# Patient Record
Sex: Female | Born: 1975 | Race: Black or African American | Hispanic: No | Marital: Married | Smoking: Current some day smoker
Health system: Southern US, Community
[De-identification: ages and names within clinical notes are randomized; demographics above are authoritative.]

## PROBLEM LIST (undated history)

## (undated) DIAGNOSIS — I10 Essential (primary) hypertension: Secondary | ICD-10-CM

## (undated) DIAGNOSIS — N841 Polyp of cervix uteri: Secondary | ICD-10-CM

## (undated) DIAGNOSIS — D869 Sarcoidosis, unspecified: Secondary | ICD-10-CM

## (undated) DIAGNOSIS — D509 Iron deficiency anemia, unspecified: Secondary | ICD-10-CM

## (undated) DIAGNOSIS — I2699 Other pulmonary embolism without acute cor pulmonale: Secondary | ICD-10-CM

## (undated) HISTORY — PX: TOTAL VAGINAL HYSTERECTOMY: SHX2548

## (undated) HISTORY — PX: NO PAST SURGERIES: SHX2092

## (undated) HISTORY — PX: BREAST SURGERY: SHX581

## (undated) HISTORY — DX: Iron deficiency anemia, unspecified: D50.9

## (undated) HISTORY — DX: Polyp of cervix uteri: N84.1

---

## 1998-02-12 ENCOUNTER — Encounter: Admission: RE | Admit: 1998-02-12 | Discharge: 1998-02-12 | Payer: Self-pay | Admitting: *Deleted

## 2014-06-03 DIAGNOSIS — H209 Unspecified iridocyclitis: Secondary | ICD-10-CM | POA: Insufficient documentation

## 2014-09-09 DIAGNOSIS — D869 Sarcoidosis, unspecified: Secondary | ICD-10-CM | POA: Insufficient documentation

## 2015-04-19 ENCOUNTER — Emergency Department (INDEPENDENT_AMBULATORY_CARE_PROVIDER_SITE_OTHER)
Admission: EM | Admit: 2015-04-19 | Discharge: 2015-04-19 | Disposition: A | Discharge status: Home / Self Care | Payer: Managed Care, Other (non HMO) | Source: Home / Self Care | Attending: Family Medicine | Admitting: Family Medicine

## 2015-04-19 ENCOUNTER — Encounter: Payer: Self-pay | Admitting: *Deleted

## 2015-04-19 DIAGNOSIS — R0981 Nasal congestion: Secondary | ICD-10-CM | POA: Diagnosis not present

## 2015-04-19 HISTORY — DX: Sarcoidosis, unspecified: D86.9

## 2015-04-19 HISTORY — DX: Essential (primary) hypertension: I10

## 2015-04-19 MED ORDER — AMOXICILLIN 875 MG PO TABS: 875.0000 mg | ORAL_TABLET | Freq: Two times a day (BID) | ORAL | Status: DC

## 2015-04-19 MED ORDER — PREDNISONE 20 MG PO TABS: 20.0000 mg | ORAL_TABLET | Freq: Two times a day (BID) | ORAL | Status: DC

## 2015-04-19 NOTE — ED Provider Notes (Signed)
CSN: [id number]     Arrival date & time 04/19/15  1003 History   First MD Initiated Contact with Patient 04/19/15 1046     Chief Complaint  Patient presents with  . Nasal Congestion      HPI Comments: Patient reports that she developed sinus congestion about two weeks ago that has persisted, and became significantly worse yesterday.  She denies cough, sore throat, and fever.  The history is provided by the patient.    Past Medical History  Diagnosis Date  . Sarcoidosis ([place])   . Hypertension    History reviewed. No pertinent past surgical history. Family History  Problem Relation Age of Onset  . Hypertension Mother    Social History  Substance Use Topics  . Smoking status: Former Research scientist (life sciences)  . Smokeless tobacco: None  . Alcohol Use: Yes   OB History    No data available     Review of Systems No sore throat No cough No pleuritic pain No wheezing + nasal congestion + post-nasal drainage + sinus pain/pressure No itchy/red eyes No earache No hemoptysis No SOB No fever/chills No nausea No vomiting No abdominal pain No diarrhea No urinary symptoms No skin rash No fatigue No myalgias No headache Used OTC meds without relief   Allergies  Review of patient's allergies indicates no known allergies.  Home Medications   Prior to Admission medications   Medication Sig Start Date End Date Taking? Authorizing Provider  amoxicillin (AMOXIL) 875 MG tablet Take 1 tablet (875 mg total) by mouth 2 (two) times daily. 04/19/15   Kandra Nicolas, MD  predniSONE (DELTASONE) 20 MG tablet Take 1 tablet (20 mg total) by mouth 2 (two) times daily. Take with food. 04/19/15   Kandra Nicolas, MD   Meds Ordered and Administered this Visit  Medications - No data to display  BP 160/89 mmHg  Pulse 84  Temp(Src) 97.7 F (36.5 C) (Oral)  Resp 18  Ht 5\' 5"  (1.651 m)  Wt 173 lb (78.472 kg)  BMI 28.79 kg/m2  SpO2 100%  LMP 04/13/2015 No data found.   Physical Exam Nursing notes  and Vital Signs reviewed. Appearance:  Patient appears stated age, and in no acute distress Eyes:  Pupils are equal, round, and reactive to light and accomodation.  Extraocular movement is intact.  Conjunctivae are not inflamed  Ears:  Canals normal.  Tympanic membranes normal.  Nose:  Congested turbinates.  No sinus tenderness.    Pharynx:  Normal Neck:  Supple.  No adenopathy.  Skin:  No rash present.   ED Course  Procedures  None    MDM   1. Sinus congestion; ?sinusitis; New onset allergic rhinitis.   Begin amoxicillin and prednisone burst. Take plain guaifenesin (1200mg  extended release tabs such as Mucinex) twice daily, with plenty of water, for cough and congestion.  May add Pseudoephedrine (30mg , one or two every 4 to 6 hours) for sinus congestion.  Get adequate rest.   May use Afrin nasal spray (or generic oxymetazoline) twice daily for about 5 days and then discontinue.  Also recommend using saline nasal spray several times daily and saline nasal irrigation (AYR is a common brand).  Use Flonase nasal spray each morning after using Afrin nasal spray and saline nasal irrigation.  Stop all antihistamines for now, and other non-prescription cough/cold preparations. Followup with Family Doctor if not improved in 7 to 10 days      Kandra Nicolas, MD 04/19/15 1102

## 2015-04-19 NOTE — ED Notes (Signed)
Pt c/o nasal congestion x 2 wks, worse x 1 day.

## 2015-04-19 NOTE — Discharge Instructions (Signed)
Take plain guaifenesin (1200mg extended release tabs such as Mucinex) twice daily, with plenty of water, for cough and congestion.  May add Pseudoephedrine (30mg, one or two every 4 to 6 hours) for sinus congestion.  Get adequate rest.   °May use Afrin nasal spray (or generic oxymetazoline) twice daily for about 5 days and then discontinue.  Also recommend using saline nasal spray several times daily and saline nasal irrigation (AYR is a common brand).  Use Flonase nasal spray each morning after using Afrin nasal spray and saline nasal irrigation. °Stop all antihistamines for now, and other non-prescription cough/cold preparations. °  °  °

## 2015-07-22 DIAGNOSIS — B353 Tinea pedis: Secondary | ICD-10-CM | POA: Insufficient documentation

## 2016-05-14 ENCOUNTER — Emergency Department (INDEPENDENT_AMBULATORY_CARE_PROVIDER_SITE_OTHER)
Admission: EM | Admit: 2016-05-14 | Discharge: 2016-05-14 | Disposition: A | Discharge status: Home / Self Care | Payer: 59 | Source: Home / Self Care | Attending: Family Medicine | Admitting: Family Medicine

## 2016-05-14 ENCOUNTER — Encounter: Payer: Self-pay | Admitting: Emergency Medicine

## 2016-05-14 DIAGNOSIS — K047 Periapical abscess without sinus: Secondary | ICD-10-CM | POA: Diagnosis not present

## 2016-05-14 DIAGNOSIS — R220 Localized swelling, mass and lump, head: Secondary | ICD-10-CM

## 2016-05-14 MED ORDER — AMOXICILLIN-POT CLAVULANATE 875-125 MG PO TABS: 1.0000 | ORAL_TABLET | Freq: Two times a day (BID) | ORAL | 0 refills | Status: DC

## 2016-05-14 MED ORDER — TRAMADOL HCL 50 MG PO TABS: 50.0000 mg | ORAL_TABLET | Freq: Four times a day (QID) | ORAL | 0 refills | Status: DC | PRN

## 2016-05-14 NOTE — Discharge Instructions (Signed)
°  Please take antibiotics as prescribed and be sure to complete entire course even if you start to feel better to ensure infection does not come back.  Tramadol is strong pain medication. While taking, do not drink alcohol, drive, or perform any other activities that requires focus while taking these medications.   You may continue to take Advil as well to help with facial pain and swelling.

## 2016-05-14 NOTE — ED Provider Notes (Signed)
CSN: [id number]     Arrival date & time 05/14/16  1405 History   First MD Initiated Contact with Patient 05/14/16 1438     Chief Complaint  Patient presents with  . Facial Swelling   (Consider location/radiation/quality/duration/timing/severity/associated sxs/prior Treatment) HPI Marissa Chaney is a 41 y.o. female presenting to UC with c/o sudden onset Right facial swelling and pain that started when she woke up.  Pain is aching and throbbing, 5/10.  Pt concerned she has a dental abscess. She reports having a chipped tooth from several months ago but states she was not having any pain on the tooth until just this morning.  She does recall feeling run down and generally achy the last 2 days but did not think much of it.  She does have a dentist but has not f/u with them "in a while."    Past Medical History:  Diagnosis Date  . Hypertension   . Sarcoidosis ([place])    History reviewed. No pertinent surgical history. Family History  Problem Relation Age of Onset  . Hypertension Mother    Social History  Substance Use Topics  . Smoking status: Former Research scientist (life sciences)  . Smokeless tobacco: Current User  . Alcohol use Yes   OB History    No data available     Review of Systems  Constitutional: Positive for fatigue. Negative for chills and fever.  HENT: Positive for facial swelling (Right side). Negative for ear pain, sneezing, sore throat, trouble swallowing and voice change.   Gastrointestinal: Negative for diarrhea, nausea and vomiting.    Allergies  Patient has no known allergies.  Home Medications   Prior to Admission medications   Medication Sig Start Date End Date Taking? Authorizing Provider  amoxicillin-clavulanate (AUGMENTIN) 875-125 MG tablet Take 1 tablet by mouth 2 (two) times daily. One po bid x 7 days 05/14/16   Noland Fordyce, PA-C  traMADol (ULTRAM) 50 MG tablet Take 1 tablet (50 mg total) by mouth every 6 (six) hours as needed. 05/14/16   Noland Fordyce, PA-C   Meds Ordered  and Administered this Visit  Medications - No data to display  BP 143/98 (BP Location: Left Arm)   Pulse 96   Temp 99.2 F (37.3 C) (Oral)   Ht 5\' 4"  (1.626 m)   Wt 152 lb 12 oz (69.3 kg)   SpO2 100%   BMI 26.22 kg/m  No data found.   Physical Exam  Constitutional: She is oriented to person, place, and time. She appears well-developed and well-nourished. No distress.  HENT:  Head: Normocephalic and atraumatic.  Right Ear: Tympanic membrane normal.  Left Ear: Tympanic membrane normal.  Nose: Nose normal.  Mouth/Throat: Uvula is midline, oropharynx is clear and moist and mucous membranes are normal. There is trismus ( mild) in the jaw. Dental abscesses and dental caries present.    Dental decay with gingival abscess in Right upper teeth and gums. Scan purulent discharge.  Right side facial swelling over maxillary sinus. Tender.   Eyes: EOM are normal.  Neck: Normal range of motion.  Cardiovascular: Normal rate.   Pulmonary/Chest: Effort normal.  Musculoskeletal: Normal range of motion.  Neurological: She is alert and oriented to person, place, and time.  Skin: Skin is warm and dry. She is not diaphoretic.  Psychiatric: She has a normal mood and affect. Her behavior is normal.  Nursing note and vitals reviewed.   Urgent Care Course     Procedures (including critical care time)  Labs Review Labs Reviewed -  No data to display  Imaging Review No results found.   MDM   1. Dental abscess   2. Right facial swelling    Hx and exam c/w Right dental abscess.  Rx: Augmentin and tramadol   Encouraged to call dentist this week for f/u. Dental resource guide also provided since she has not seen her dentist "in a while" and may not be able to fit her in right away.    Noland Fordyce, PA-C 05/14/16 1534

## 2016-05-14 NOTE — ED Triage Notes (Signed)
Pt c/o right sided facial swelling this morning, unsure if coming from chipped tooth or not.

## 2016-05-16 ENCOUNTER — Telehealth: Payer: Self-pay | Admitting: *Deleted

## 2016-05-16 NOTE — Telephone Encounter (Signed)
Callback: no answer, LMOM f/u from visit. Call back as needed.

## 2016-05-17 ENCOUNTER — Telehealth: Payer: Self-pay

## 2016-05-17 MED ORDER — FLUCONAZOLE 150 MG PO TABS: ORAL_TABLET | ORAL | 0 refills | Status: DC

## 2016-06-30 ENCOUNTER — Encounter: Payer: Self-pay | Admitting: *Deleted

## 2016-06-30 ENCOUNTER — Emergency Department (INDEPENDENT_AMBULATORY_CARE_PROVIDER_SITE_OTHER)
Admission: EM | Admit: 2016-06-30 | Discharge: 2016-06-30 | Disposition: A | Discharge status: Home / Self Care | Payer: 59 | Source: Home / Self Care | Attending: Family Medicine | Admitting: Family Medicine

## 2016-06-30 DIAGNOSIS — J101 Influenza due to other identified influenza virus with other respiratory manifestations: Secondary | ICD-10-CM | POA: Diagnosis not present

## 2016-06-30 LAB — POCT CBC W AUTO DIFF (K'VILLE URGENT CARE)

## 2016-06-30 LAB — POCT INFLUENZA A/B
Influenza A, POC: POSITIVE — AB
Influenza B, POC: NEGATIVE

## 2016-06-30 MED ORDER — IBUPROFEN 600 MG PO TABS
600.0000 mg | ORAL_TABLET | Freq: Once | ORAL | Status: AC
  Administered 2016-06-30: 600 mg via ORAL

## 2016-06-30 MED ORDER — OSELTAMIVIR PHOSPHATE 75 MG PO CAPS: 75.0000 mg | ORAL_CAPSULE | Freq: Two times a day (BID) | ORAL | 0 refills | Status: DC

## 2016-06-30 MED ORDER — AZITHROMYCIN 250 MG PO TABS: ORAL_TABLET | ORAL | 0 refills | Status: DC

## 2016-06-30 NOTE — ED Provider Notes (Signed)
[facility]    CSN: [id number] Arrival date & time: 06/30/16  1306     History   Chief Complaint Chief Complaint  Patient presents with  . Fever  . Cough    HPI Marissa Chaney is a 41 y.o. female.   Three days ago patient suddenly developed a non-productive cough and fever, and the next day she developed nasal congestion but no sore throat.  Her fever persists each night.  She has been fatigued.  No pleuritic pain or shortness of breath.  She has a history of sarcoid.   The history is provided by the patient.    Past Medical History:  Diagnosis Date  . Hypertension   . Sarcoidosis ([employer])     There are no active problems to display for this patient.   History reviewed. No pertinent surgical history.  OB History    No data available       Home Medications    Prior to Admission medications   Medication Sig Start Date End Date Taking? Authorizing Provider  azithromycin (ZITHROMAX Z-PAK) 250 MG tablet Take 2 tabs today; then begin one tab once daily for 4 more days. 06/30/16   Kandra Nicolas, MD  oseltamivir (TAMIFLU) 75 MG capsule Take 1 capsule (75 mg total) by mouth every 12 (twelve) hours. 06/30/16   Kandra Nicolas, MD    Family History Family History  Problem Relation Age of Onset  . Hypertension Mother     Social History Social History  Substance Use Topics  . Smoking status: Former Research scientist (life sciences)  . Smokeless tobacco: Current User  . Alcohol use Yes     Allergies   Patient has no known allergies.   Review of Systems Review of Systems No sore throat + cough No pleuritic pain No wheezing + nasal congestion ? post-nasal drainage No sinus pain/pressure No itchy/red eyes No earache No hemoptysis No SOB + fever, + chills No nausea No vomiting No abdominal pain No diarrhea No urinary symptoms No skin rash + fatigue + myalgias No headache Used OTC meds without relief   Physical Exam Triage Vital Signs ED Triage Vitals  Enc  Vitals Group     BP 06/30/16 1344 125/84     Pulse Rate 06/30/16 1344 (!) 111     Resp 06/30/16 1344 20     Temp 06/30/16 1344 (!) 102.5 F (39.2 C)     Temp Source 06/30/16 1344 Oral     SpO2 06/30/16 1344 99 %     Weight 06/30/16 1345 144 lb (65.3 kg)     Height --      Head Circumference --      Peak Flow --      Pain Score 06/30/16 1345 0     Pain Loc --      Pain Edu? --      Excl. in [place]? --    No data found.   Updated Vital Signs BP 125/84 (BP Location: Left Arm)   Pulse (!) 111   Temp (!) 102.5 F (39.2 C) (Oral)   Resp 20   Wt 144 lb (65.3 kg)   LMP 06/29/2016   SpO2 99%   BMI 24.72 kg/m   Visual Acuity Right Eye Distance:   Left Eye Distance:   Bilateral Distance:    Right Eye Near:   Left Eye Near:    Bilateral Near:     Physical Exam Nursing notes and Vital Signs reviewed. Appearance:  Patient appears stated  age, and in no acute distress Eyes:  Pupils are equal, round, and reactive to light and accomodation.  Extraocular movement is intact.  Conjunctivae are not inflamed  Ears:  Canals normal.  Tympanic membranes normal.  Nose:  Mildly congested turbinates.  No sinus tenderness.    Pharynx:  Normal Neck:  Supple.  Nontender enlarged posterior/lateral nodes are palpated bilaterally  Lungs:  Clear to auscultation.  Breath sounds are equal.  Moving air well. Heart:  Regular rate and rhythm without murmurs, rubs, or gallops.  Abdomen:  Nontender without masses or hepatosplenomegaly.  Bowel sounds are present.  No CVA or flank tenderness.  Extremities:  No edema.  Skin:  No rash present.    UC Treatments / Results  Labs (all labs ordered are listed, but only abnormal results are displayed) Labs Reviewed  POCT INFLUENZA A/B - Abnormal; Notable for the following:       Result Value   Influenza A, POC Positive (*)    All other components within normal limits  POCT CBC W AUTO DIFF ([facility]):  WBC 2.8; LY 28.7; MO 25.7; GR 45.6; Hgb 11.6;  Platelets 255     EKG  EKG Interpretation None       Radiology No results found.  Procedures Procedures (including critical care time)  Medications Ordered in UC Medications  ibuprofen (ADVIL,MOTRIN) tablet 600 mg (600 mg Oral Given 06/30/16 1347)     Initial Impression / Assessment and Plan / UC Course  I have reviewed the triage vital signs and the nursing notes.  Pertinent labs & imaging results that were available during my care of the patient were reviewed by me and considered in my medical decision making (see chart for details).    Begin Tamiflu, and empiric Z-pak. Increase fluid intake. May take Delsym Cough Suppressant at bedtime for nighttime cough.  Stop all antihistamines for now, and other non-prescription cough/cold preparations. May take Ibuprofen 200mg , 4 tabs every 8 hours with food for fever, body aches, etc. Followup with Family Doctor if not improved in 5 days.    Final Clinical Impressions(s) / UC Diagnoses   Final diagnoses:  Influenza A    New Prescriptions New Prescriptions   AZITHROMYCIN (ZITHROMAX Z-PAK) 250 MG TABLET    Take 2 tabs today; then begin one tab once daily for 4 more days.   OSELTAMIVIR (TAMIFLU) 75 MG CAPSULE    Take 1 capsule (75 mg total) by mouth every 12 (twelve) hours.     Kandra Nicolas, MD 07/06/16 [id number]

## 2016-06-30 NOTE — ED Triage Notes (Signed)
Patient c/o 3 days of productive cough, fever, and fatigue. Taken Advil otc.H/O sarcoidosis.

## 2016-06-30 NOTE — Discharge Instructions (Signed)
Increase fluid intake. May take Delsym Cough Suppressant at bedtime for nighttime cough.  Stop all antihistamines for now, and other non-prescription cough/cold preparations. May take Ibuprofen 200mg , 4 tabs every 8 hours with food for fever, body aches, etc.

## 2018-04-12 ENCOUNTER — Other Ambulatory Visit: Payer: Self-pay

## 2018-04-12 ENCOUNTER — Emergency Department (INDEPENDENT_AMBULATORY_CARE_PROVIDER_SITE_OTHER)
Admission: EM | Admit: 2018-04-12 | Discharge: 2018-04-12 | Disposition: A | Discharge status: Home / Self Care | Payer: 59 | Source: Home / Self Care | Attending: Family Medicine | Admitting: Family Medicine

## 2018-04-12 DIAGNOSIS — R5383 Other fatigue: Secondary | ICD-10-CM

## 2018-04-12 DIAGNOSIS — Z862 Personal history of diseases of the blood and blood-forming organs and certain disorders involving the immune mechanism: Secondary | ICD-10-CM

## 2018-04-12 DIAGNOSIS — E559 Vitamin D deficiency, unspecified: Secondary | ICD-10-CM

## 2018-04-12 NOTE — ED Provider Notes (Signed)
[facility]    CSN: [id number] Arrival date & time: 04/12/18  1106     History   Chief Complaint Chief Complaint  Patient presents with  . Back Pain    lower left  . Eye Problem    (pain)    HPI Marissa Chaney is a 43 y.o. female.   Patient complains of persistent fatigue for about 6 months, although she does not feel ill.  While bowling about 3 weeks ago she "pulled" a left lower back muscle.  The pain has improved, but persists.  She denies bowel or bladder dysfunction, and no saddle numbness.  She notes that her periods have been irregular for about a year.  She has persistent hot flashes and night sweats without fever.  She has a history of sarcoid that has remained stable.  On recent blood testing at work, she had a low Hgb 11.3, and low WBC 3.2.  She also had low TSH 0.35 and low Vitamin D25-OH (13).  She states that she eats a vegetarian diet.  The history is provided by the patient.    Past Medical History:  Diagnosis Date  . Hypertension   . Sarcoidosis     There are no active problems to display for this patient.   History reviewed. No pertinent surgical history.  OB History   No obstetric history on file.      Home Medications    Prior to Admission medications   Not on File    Family History Family History  Problem Relation Age of Onset  . Hypertension Mother     Social History Social History   Tobacco Use  . Smoking status: Former Research scientist (life sciences)  . Smokeless tobacco: Current User  Substance Use Topics  . Alcohol use: Yes  . Drug use: No     Allergies   Patient has no known allergies.   Review of Systems Review of Systems No sore throat No cough No pleuritic pain No wheezing No nasal congestion No post-nasal drainage No sinus pain/pressure No itchy/red eyes No earache No hemoptysis No SOB No fever/chills No nausea No vomiting No abdominal pain + left back pain No diarrhea No urinary symptoms No skin rash +  fatigue No myalgias No headache    Physical Exam Triage Vital Signs ED Triage Vitals [04/12/18 1128]  Enc Vitals Group     BP 136/89     Pulse Rate 72     Resp      Temp 98.1 F (36.7 C)     Temp Source Oral     SpO2 100 %     Weight 160 lb (72.6 kg)     Height 5\' 4"  (1.626 m)     Head Circumference      Peak Flow      Pain Score      Pain Loc      Pain Edu?      Excl. in [place]?    No data found.  Updated Vital Signs BP 136/89 (BP Location: Right Arm)   Pulse 72   Temp 98.1 F (36.7 C) (Oral)   Ht 5\' 4"  (1.626 m)   Wt 72.6 kg   SpO2 100%   BMI 27.46 kg/m   Visual Acuity Right Eye Distance:   Left Eye Distance:   Bilateral Distance:    Right Eye Near:   Left Eye Near:    Bilateral Near:     Physical Exam Nursing notes and Vital Signs reviewed. Appearance:  Patient appears stated age, and in no acute distress Eyes:  Pupils are equal, round, and reactive to light and accomodation.  Extraocular movement is intact.  Conjunctivae are not inflamed  Ears:  Canals normal.  Tympanic membranes normal.  Nose:  Normal turbinates.  No sinus tenderness.  Pharynx:  Normal Neck:  Supple. No adenopathy or thyromegaly. Lungs:  Clear to auscultation.  Breath sounds are equal.  Moving air well. Heart:  Regular rate and rhythm without murmurs, rubs, or gallops.  Abdomen:  Nontender without masses or hepatosplenomegaly.  Bowel sounds are present.  No CVA or flank tenderness.  Back:  Range of motion relatively well preserved.  Minimal tenderness in the left lateral buttock area.  Straight leg raising test is negative.  Sitting knee extension test is negative.  Strength and sensation in the lower extremities is normal.  Patellar and achilles reflexes are normal  Extremities:  No edema.  Skin:  No rash present.    UC Treatments / Results  Labs (all labs ordered are listed, but only abnormal results are displayed) Labs Reviewed  CBC WITH DIFFERENTIAL/PLATELET  TSH  T4, FREE    T3, FREE  VITAMIN D 25 HYDROXY (VIT D DEFICIENCY, FRACTURES)  B12 AND FOLATE PANEL    EKG None  Radiology No results found.  Procedures Procedures (including critical care time)  Medications Ordered in UC Medications - No data to display  Initial Impression / Assessment and Plan / UC Course  I have reviewed the triage vital signs and the nursing notes.  Pertinent labs & imaging results that were available during my care of the patient were reviewed by me and considered in my medical decision making (see chart for details).    Normal exam reassuring. Suspect back pain is a resolving muscle strain left buttock. Labs pending:  CBC, CMP, TSH, free T3, free T4, Vitamin D25-OH, Vitamin B12, Folate. Followup with Family Doctor for follow-up, and to establish care.   Final Clinical Impressions(s) / UC Diagnoses   Final diagnoses:  Fatigue, unspecified type  Vitamin D deficiency  Personal history of sarcoidosis     Discharge Instructions     Recommend taking a multiple daily vitamin, in addition to Vitamin D and calcium    ED Prescriptions    None         Kandra Nicolas, MD 04/12/18 1307

## 2018-04-12 NOTE — ED Triage Notes (Addendum)
Pt c/o pain behind her eyes x a few months. Also c/o lower back pain on left side and generalized weakness. Pt mentions having sarcoidosis and is not sure if that is related. Takes advil prn.

## 2018-04-12 NOTE — Discharge Instructions (Signed)
Recommend taking a multiple daily vitamin, in addition to Vitamin D and calcium

## 2018-04-12 NOTE — ED Triage Notes (Deleted)
Erroneous entry

## 2018-04-13 ENCOUNTER — Telehealth: Payer: Self-pay | Admitting: Emergency Medicine

## 2018-04-13 LAB — CBC WITH DIFFERENTIAL/PLATELET
Absolute Monocytes: 469 cells/uL (ref 200–950)
Basophils Absolute: 49 cells/uL (ref 0–200)
Basophils Relative: 1.4 %
EOS PCT: 5.7 %
Eosinophils Absolute: 200 cells/uL (ref 15–500)
HCT: 34.2 % — ABNORMAL LOW (ref 35.0–45.0)
Hemoglobin: 11.3 g/dL — ABNORMAL LOW (ref 11.7–15.5)
Lymphs Abs: 1292 cells/uL (ref 850–3900)
MCH: 30.9 pg (ref 27.0–33.0)
MCHC: 33 g/dL (ref 32.0–36.0)
MCV: 93.4 fL (ref 80.0–100.0)
MONOS PCT: 13.4 %
MPV: 11 fL (ref 7.5–12.5)
Neutro Abs: 1491 cells/uL — ABNORMAL LOW (ref 1500–7800)
Neutrophils Relative %: 42.6 %
PLATELETS: 301 10*3/uL (ref 140–400)
RBC: 3.66 10*6/uL — ABNORMAL LOW (ref 3.80–5.10)
RDW: 12.9 % (ref 11.0–15.0)
TOTAL LYMPHOCYTE: 36.9 %
WBC: 3.5 10*3/uL — ABNORMAL LOW (ref 3.8–10.8)

## 2018-04-13 LAB — T3, FREE: T3 FREE: 2.9 pg/mL (ref 2.3–4.2)

## 2018-04-13 LAB — B12 AND FOLATE PANEL
Folate: 14.4 ng/mL
VITAMIN B 12: 275 pg/mL (ref 200–1100)

## 2018-04-13 LAB — TSH: TSH: 0.57 m[IU]/L

## 2018-04-13 LAB — VITAMIN D 25 HYDROXY (VIT D DEFICIENCY, FRACTURES): VIT D 25 HYDROXY: 6 ng/mL — AB (ref 30–100)

## 2018-04-13 LAB — T4, FREE: Free T4: 1 ng/dL (ref 0.8–1.8)

## 2018-04-13 NOTE — Telephone Encounter (Signed)
-----   Message from Noe Gens, [place] sent at 04/13/2018  5:09 PM EST ----- Please forward this to Dr. Assunta Found, who saw pt.  Please also ensure she has follow up with primary care.  If she does not have a PCP, please provider her with the phone number for next door primary care. Pt Dr. Aurelio Brash note, pt should be taking an OTC vitamin D supplement in the meantime.  ----- Message ----- From: Britta Mccreedy, CMA Sent: 04/13/2018   5:00 PM EST To: Noe Gens, PA-C  Please review recent labs.  Thanks.

## 2018-04-13 NOTE — Telephone Encounter (Signed)
LMTRC

## 2018-04-14 ENCOUNTER — Telehealth: Payer: Self-pay | Admitting: Emergency Medicine

## 2018-04-14 NOTE — Telephone Encounter (Signed)
-----   Message from Noe Gens, [place] sent at 04/13/2018  5:09 PM EST ----- Please forward this to Dr. Assunta Found, who saw pt.  Please also ensure she has follow up with primary care.  If she does not have a PCP, please provider her with the phone number for next door primary care. Pt Dr. Aurelio Brash note, pt should be taking an OTC vitamin D supplement in the meantime.  ----- Message ----- From: Britta Mccreedy, CMA Sent: 04/13/2018   5:00 PM EST To: Noe Gens, PA-C  Please review recent labs.  Thanks.

## 2018-04-14 NOTE — Telephone Encounter (Signed)
Patient informed that Dr Assunta Found will review labs on Monday and patient will be notified.  Patient does have an appt with PCP on 04/26/18.  Patient voices understanding.

## 2018-04-16 NOTE — Telephone Encounter (Signed)
Left message on VM to call for lab results.  Dr Assunta Found reviewed labs and feels that she needs to see a PCP to have labs reviewed and prescribed medication accordingly.  If she doesn't have a PCP, she can be referred next door.

## 2018-04-16 NOTE — Telephone Encounter (Signed)
Pt advised to f/u with PCP re Vit D labs. Pt says she made appt for 2/7. Per Dr Assunta Found, take 1000 iu BID until she sees the PCP. Pt acknowledged.

## 2018-04-26 ENCOUNTER — Encounter: Payer: Self-pay | Admitting: Physician Assistant

## 2018-04-26 ENCOUNTER — Ambulatory Visit (INDEPENDENT_AMBULATORY_CARE_PROVIDER_SITE_OTHER): Payer: 59 | Admitting: Physician Assistant

## 2018-04-26 VITALS — BP 139/90 | HR 69 | Ht 64.0 in | Wt 160.0 lb

## 2018-04-26 DIAGNOSIS — M5442 Lumbago with sciatica, left side: Secondary | ICD-10-CM | POA: Diagnosis not present

## 2018-04-26 DIAGNOSIS — Z7689 Persons encountering health services in other specified circumstances: Secondary | ICD-10-CM | POA: Diagnosis not present

## 2018-04-26 DIAGNOSIS — I10 Essential (primary) hypertension: Secondary | ICD-10-CM

## 2018-04-26 DIAGNOSIS — R5382 Chronic fatigue, unspecified: Secondary | ICD-10-CM

## 2018-04-26 DIAGNOSIS — M5416 Radiculopathy, lumbar region: Secondary | ICD-10-CM | POA: Insufficient documentation

## 2018-04-26 DIAGNOSIS — N923 Ovulation bleeding: Secondary | ICD-10-CM

## 2018-04-26 DIAGNOSIS — E559 Vitamin D deficiency, unspecified: Secondary | ICD-10-CM | POA: Diagnosis not present

## 2018-04-26 DIAGNOSIS — L280 Lichen simplex chronicus: Secondary | ICD-10-CM

## 2018-04-26 DIAGNOSIS — Z1231 Encounter for screening mammogram for malignant neoplasm of breast: Secondary | ICD-10-CM

## 2018-04-26 DIAGNOSIS — D649 Anemia, unspecified: Secondary | ICD-10-CM | POA: Diagnosis not present

## 2018-04-26 DIAGNOSIS — Z113 Encounter for screening for infections with a predominantly sexual mode of transmission: Secondary | ICD-10-CM

## 2018-04-26 DIAGNOSIS — D869 Sarcoidosis, unspecified: Secondary | ICD-10-CM

## 2018-04-26 MED ORDER — CLOBETASOL PROPIONATE 0.05 % EX OINT: TOPICAL_OINTMENT | Freq: Two times a day (BID) | CUTANEOUS | 1 refills | Status: DC | PRN

## 2018-04-26 MED ORDER — FERROUS SULFATE 325 (65 FE) MG PO TBEC: DELAYED_RELEASE_TABLET | ORAL | 11 refills | Status: DC

## 2018-04-26 MED ORDER — TRIAMCINOLONE ACETONIDE 0.1 % EX CREA: TOPICAL_CREAM | Freq: Two times a day (BID) | CUTANEOUS | 1 refills | Status: DC | PRN

## 2018-04-26 MED ORDER — ERGOCALCIFEROL 1.25 MG (50000 UT) PO CAPS: 50000.0000 [IU] | ORAL_CAPSULE | ORAL | 0 refills | Status: DC

## 2018-04-26 MED ORDER — AMLODIPINE BESYLATE 10 MG PO TABS: 10.0000 mg | ORAL_TABLET | Freq: Every day | ORAL | 0 refills | Status: DC

## 2018-04-26 NOTE — Patient Instructions (Addendum)
For your blood pressure: - Goal <130/80 (Ideally 120's/70's) - take your blood pressure medication in the morning (unless instructed differently) - monitor and log blood pressures at home - check around the same time each day in a relaxed setting - Limit salt to <2500 mg/day - Follow DASH (Dietary Approach to Stopping Hypertension) eating plan - Try to get at least 150 minutes of aerobic exercise per week - Aim to go on a brisk walk 30 minutes per day at least 5 days per week. If you're not active, gradually increase how long you walk by 5 minutes each week - limit alcohol: 2 standard drinks per day for men and 1 per day for women - avoid tobacco/nicotine products. Consider smoking cessation if you smoke - weight loss: 7% of current body weight can reduce your blood pressure by 5-10 points - follow-up at least every 6 months for your blood pressure. Follow-up sooner if your BP is not controlled  For rash - follow-up with Rheumatology - use EITHER Triamcinolone or Clobetasol. Dont use both on the same area at once. - cycle use 1 week on 1 week off - do not use for >2 weeks on the same area

## 2018-04-26 NOTE — Progress Notes (Signed)
HPI:                                                                Marissa Chaney is a 43 y.o. 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 States she was "cleared" by pulmonology in 2017. Per Pulmonology note dated 02/07/2016 her surveillance PFT's and CXR' were normal. Per last Rheumatology note dated 09/09/14 plan was start to Methotrexate and follow-up in 3 months, but she was lost to follow-up. She would like to re-establish with a rheumatologist.  Back pain: described feeling a "catch" or pulled muscle in her left LBP beginning on NYE after bowling approx 5 weeks ago.  Radicular pain started 3 weeks ago described as sharp pain from left low back traveling down left leg to the calf . Has been taking Advil and using a heating pad without much relief. Denies saddle numbness or bowel/bladder dysfunction or progressive weakness.  Rash: c/o recurrent, pruritic rash of her forearm and left lower leg. She is requesting refills of Clobetasol and Triamcinolone.  Menstrual change: reports for the last 2 years, she has noticed a change in her menstrual periods. Still having periods every month lasting 7 days, but now with intermenstrual spotting lasting for days to a week. She also endorses post-coital bleeding for last 2 years.  HTN: diagnosed age 70. Has not taken medication for a number of years.  Does not check BP's at home. Denies vision change, headache, chest pain with exertion, orthopnea, lightheadedness, syncope and edema. Risk factors include: tobacco use  Fatigue: this has been a chronic problem. She was recently diagnosed with vitamin D deficiency. She is taking an OTC vitamin D3 supplement. She also has a hx of anemia. Denies constitutional symptoms.  Depression screen [facility] 2/9 04/26/2018  Decreased Interest 3  Down, Depressed, Hopeless 1  PHQ  - 2 Score 4  Altered sleeping 3  Tired, decreased energy 3  Change in appetite 0  Feeling bad or failure about yourself  0  Trouble concentrating 1  Moving slowly or fidgety/restless 1  Suicidal thoughts 0  PHQ-9 Score 12    GAD 7 : Generalized Anxiety Score 04/26/2018  Nervous, Anxious, on Edge 1  Control/stop worrying 1  Worry too much - different things 1  Trouble relaxing 1  Restless 0  Easily annoyed or irritable 1  Afraid - awful might happen 1  Total GAD 7 Score 6      Past Medical History:  Diagnosis Date  . Hypertension   . Iron deficiency anemia 04/29/2018  . Sarcoidosis    Past Surgical History:  Procedure Laterality Date  . NO PAST SURGERIES     Social History   Tobacco Use  . Smoking status: Current Every Day Smoker    Types: Cigarettes  . Smokeless tobacco: Never Used  . Tobacco comment: 1-2 cig/day  Substance Use Topics  . Alcohol use: Yes    Alcohol/week: 0.0 - 1.0 standard drinks   family history includes Hypertension in her mother.    ROS: Review of Systems  Constitutional: Positive for chills, diaphoresis and malaise/fatigue.  Eyes:       + vision change  Gastrointestinal: Positive for nausea.  Musculoskeletal: Positive for back pain, joint pain and myalgias.  Skin: Positive for itching and rash.  Neurological: Positive for weakness and headaches.  Psychiatric/Behavioral: Positive for depression. The patient is nervous/anxious and has insomnia.      Medications: Current Outpatient Medications  Medication Sig Dispense Refill  . clobetasol ointment (TEMOVATE) 0.05 % Apply topically 2 (two) times daily as needed. Avoid contact with face/genitals. Cycle use 1 week on, 1 week off 15 g 1  . ergocalciferol (VITAMIN D2) 1.25 MG (50000 UT) capsule Take 1 capsule (50,000 Units total) by mouth once a week. 8 capsule 0  . triamcinolone cream (KENALOG) 0.1 % Apply topically 2 (two) times daily as needed. Avoid contact with face/genitals. Cycle use  1 week on, 1 week off 45 g 1  . vitamin B-12 (CYANOCOBALAMIN) 250 MCG tablet Take 250 mcg by mouth daily.    Marland Kitchen amLODipine (NORVASC) 10 MG tablet Take 1 tablet (10 mg total) by mouth daily. 30 tablet 0  . ferrous sulfate 325 (65 FE) MG EC tablet Take 1 tab by mouth three times daily with a meal every other day 90 tablet 11   No current facility-administered medications for this visit.    No Known Allergies     Objective:  BP 139/90   Pulse 69   Ht 5\' 4"  (1.626 m)   Wt 160 lb (72.6 kg)   LMP 04/14/2018 (Exact Date)   BMI 27.46 kg/m  Gen:  alert, not ill-appearing, no distress, appropriate for age 22: head normocephalic without obvious abnormality, conjunctiva and cornea clear, wearing glasses, trachea midline Pulm: Normal work of breathing, normal phonation, clear to auscultation bilaterally, no wheezes, rales or rhonchi CV: Normal rate, regular rhythm, s1 and s2 distinct, no murmurs, clicks or rubs  Neuro: alert and oriented x 3, no tremor MSK: extremities atraumatic, strength 5/5 symmetric in bilateral lower extremities, normal gait and station, no peripheral edema Back: atraumatic, no midline tenderness, positive SLR on the left Skin: intact, right forearm and left calf there is hyperpigmented, lichenified plaques and coalescing papules     Psych: well-groomed, cooperative, good eye contact, euthymic mood, affect mood-congruent, speech is articulate, and thought processes clear and goal-directed  Lab Results  Component Value Date   CREATININE 0.75 04/26/2018   BUN 8 04/26/2018   NA 139 04/26/2018   K 4.2 04/26/2018   CL 106 04/26/2018   CO2 26 04/26/2018    Lab Results  Component Value Date   ALT 17 04/26/2018   AST 23 04/26/2018   BILITOT 0.3 04/26/2018    Lab Results  Component Value Date   WBC 3.5 (L) 04/12/2018   HGB 11.3 (L) 04/12/2018   HCT 34.2 (L) 04/12/2018   MCV 93.4 04/12/2018   PLT 301 04/12/2018   Lab Results  Component Value Date   TSH  0.57 04/12/2018   Lab Results  Component Value Date   VITAMINB12 275 04/12/2018   Lab Results  Component Value Date   FOLATE 14.4 04/12/2018     No results found for this or any previous visit (from the past 72 hour(s)). No results found.    Assessment and Plan: 43 y.o. female with   .Rogelio was seen today for establish care.  Diagnoses and all orders for this visit:  Encounter to establish care  Vitamin D deficiency -     ergocalciferol (VITAMIN D2) 1.25 MG (50000 UT) capsule; Take 1 capsule (50,000 Units total) by mouth once a  week.  Normocytic anemia -     Fe+TIBC+Fer -     ferrous sulfate 325 (65 FE) MG EC tablet; Take 1 tab by mouth three times daily with a meal every other day  Acute left-sided low back pain with left-sided sciatica -     DG Lumbar Spine Complete  Sarcoidosis -     Sedimentation rate -     C-reactive protein -     Ambulatory referral to Rheumatology  Intermenstrual bleeding -     Ambulatory referral to Obstetrics / Gynecology  Chronic fatigue  Uncontrolled hypertension -     COMPLETE METABOLIC PANEL WITH GFR -     amLODipine (NORVASC) 10 MG tablet; Take 1 tablet (10 mg total) by mouth daily.  Lichen simplex chronicus -     triamcinolone cream (KENALOG) 0.1 %; Apply topically 2 (two) times daily as needed. Avoid contact with face/genitals. Cycle use 1 week on, 1 week off -     clobetasol ointment (TEMOVATE) 0.05 %; Apply topically 2 (two) times daily as needed. Avoid contact with face/genitals. Cycle use 1 week on, 1 week off  Breast cancer screening by mammogram -     MM 3D SCREEN BREAST BILATERAL; Future  Routine screening for STI (sexually transmitted infection) -     C. trachomatis/N. gonorrhoeae RNA -     Hepatitis C antibody -     HIV Antibody (routine testing w rflx) -     RPR -     Hepatitis C antibody -     HIV Antibody (routine testing w rflx) -     C. trachomatis/N. gonorrhoeae RNA -     RPR     - Personally  reviewed PMH, PSH, PFH, medications, allergies, HM - Personally reviewed labs from 04/12/2018 - Age-appropriate cancer screening: Pap UTD per patient, requesting record from Dr. Sharlet [facility]); mammogram ordered today - Influenza declined - Tdap unknown - PHQ2 positive, no acute safety issues    Patient education and anticipatory guidance given Patient agrees with treatment plan Follow-up in 1 month for HTN/back pain or sooner as needed if symptoms worsen or fail to improve  Darlyne Russian PA-C

## 2018-04-29 ENCOUNTER — Encounter: Payer: Self-pay | Admitting: Physician Assistant

## 2018-04-29 DIAGNOSIS — D509 Iron deficiency anemia, unspecified: Secondary | ICD-10-CM

## 2018-04-29 HISTORY — DX: Iron deficiency anemia, unspecified: D50.9

## 2018-04-29 LAB — HEPATITIS C ANTIBODY
Hepatitis C Ab: NONREACTIVE
SIGNAL TO CUT-OFF: 0.02 (ref <1.00)

## 2018-04-29 LAB — COMPLETE METABOLIC PANEL WITH GFR
AG RATIO: 1.5 (calc) (ref 1.0–2.5)
ALBUMIN MSPROF: 4.3 g/dL (ref 3.6–5.1)
ALT: 17 U/L (ref 6–29)
AST: 23 U/L (ref 10–30)
Alkaline phosphatase (APISO): 78 U/L (ref 31–125)
BILIRUBIN TOTAL: 0.3 mg/dL (ref 0.2–1.2)
BUN: 8 mg/dL (ref 7–25)
CO2: 26 mmol/L (ref 20–32)
Calcium: 10.5 mg/dL — ABNORMAL HIGH (ref 8.6–10.2)
Chloride: 106 mmol/L (ref 98–110)
Creat: 0.75 mg/dL (ref 0.50–1.10)
GFR, EST AFRICAN AMERICAN: 115 mL/min/{1.73_m2} (ref >=60)
GFR, Est Non African American: 99 mL/min/{1.73_m2} (ref >=60)
GLOBULIN: 2.8 g/dL (ref 1.9–3.7)
GLUCOSE: 79 mg/dL (ref 65–99)
POTASSIUM: 4.2 mmol/L (ref 3.5–5.3)
SODIUM: 139 mmol/L (ref 135–146)
TOTAL PROTEIN: 7.1 g/dL (ref 6.1–8.1)

## 2018-04-29 LAB — IRON,TIBC AND FERRITIN PANEL
%SAT: 14 % (calc) — ABNORMAL LOW (ref 16–45)
Ferritin: 8 ng/mL — ABNORMAL LOW (ref 16–232)
IRON: 58 ug/dL (ref 40–190)
TIBC: 406 ug/dL (ref 250–450)

## 2018-04-29 LAB — HIV ANTIBODY (ROUTINE TESTING W REFLEX): HIV 1&2 Ab, 4th Generation: NONREACTIVE

## 2018-04-29 LAB — RPR: RPR Ser Ql: NONREACTIVE

## 2018-04-29 LAB — C-REACTIVE PROTEIN: CRP: 0.8 mg/L (ref <8.0)

## 2018-04-29 LAB — SEDIMENTATION RATE: Sed Rate: 17 mm/h (ref 0–20)

## 2018-04-29 LAB — C. TRACHOMATIS/N. GONORRHOEAE RNA
C. trachomatis RNA, TMA: NOT DETECTED
N. gonorrhoeae RNA, TMA: NOT DETECTED

## 2018-05-01 ENCOUNTER — Encounter: Payer: Self-pay | Admitting: Physician Assistant

## 2018-05-01 DIAGNOSIS — L280 Lichen simplex chronicus: Secondary | ICD-10-CM | POA: Insufficient documentation

## 2018-05-01 DIAGNOSIS — R5382 Chronic fatigue, unspecified: Secondary | ICD-10-CM | POA: Insufficient documentation

## 2018-05-02 ENCOUNTER — Encounter: Payer: Self-pay | Admitting: Physician Assistant

## 2018-05-13 ENCOUNTER — Encounter: Payer: Self-pay | Admitting: Obstetrics & Gynecology

## 2018-05-13 ENCOUNTER — Ambulatory Visit (INDEPENDENT_AMBULATORY_CARE_PROVIDER_SITE_OTHER): Payer: 59 | Admitting: Obstetrics & Gynecology

## 2018-05-13 ENCOUNTER — Encounter (HOSPITAL_BASED_OUTPATIENT_CLINIC_OR_DEPARTMENT_OTHER): Payer: Self-pay

## 2018-05-13 ENCOUNTER — Ambulatory Visit (HOSPITAL_BASED_OUTPATIENT_CLINIC_OR_DEPARTMENT_OTHER)
Admission: RE | Admit: 2018-05-13 | Discharge: 2018-05-13 | Disposition: A | Discharge status: Home / Self Care | Payer: 59 | Source: Ambulatory Visit | Attending: Physician Assistant | Admitting: Physician Assistant

## 2018-05-13 ENCOUNTER — Ambulatory Visit (INDEPENDENT_AMBULATORY_CARE_PROVIDER_SITE_OTHER): Payer: 59

## 2018-05-13 VITALS — BP 130/85 | HR 91 | Resp 16 | Ht 64.0 in | Wt 156.0 lb

## 2018-05-13 DIAGNOSIS — N938 Other specified abnormal uterine and vaginal bleeding: Secondary | ICD-10-CM | POA: Diagnosis not present

## 2018-05-13 DIAGNOSIS — D259 Leiomyoma of uterus, unspecified: Secondary | ICD-10-CM

## 2018-05-13 DIAGNOSIS — Z1231 Encounter for screening mammogram for malignant neoplasm of breast: Secondary | ICD-10-CM | POA: Insufficient documentation

## 2018-05-13 DIAGNOSIS — Z23 Encounter for immunization: Secondary | ICD-10-CM

## 2018-05-13 NOTE — Progress Notes (Signed)
Patient ID: Marissa Chaney, female   DOB: Dec 30, 1975, 43 y.o.   MRN: [id number]  Chief Complaint  Patient presents with  . Menstrual Problem    HPI Marissa Chaney is a 43 y.o. married P2 (21 son senior at [employer] and 61 yo daughter) here today with menstrual irregularities for a few years. She is finally putting herself first and getting health care concerns addressed.  Her periods last 7 days and come with no regularity. She may have 2 periods per months. She never skips periods. She has to keep a bag of tampons with her at all times.  She does not use contraception, would like a hyterectomy. She has sex about 2 x per month.  Her hgb was 11.22 March 2018.TSH normal then.  Past Medical History:  Diagnosis Date  . Hypertension   . Iron deficiency anemia 04/29/2018  . Sarcoidosis     Past Surgical History:  Procedure Laterality Date  . NO PAST SURGERIES      Family History  Problem Relation Age of Onset  . Hypertension Mother     Social History Social History   Tobacco Use  . Smoking status: Current Every Day Smoker    Types: Cigarettes  . Smokeless tobacco: Never Used  . Tobacco comment: 1-2 cig/day  Substance Use Topics  . Alcohol use: Yes    Alcohol/week: 0.0 - 1.0 standard drinks  . Drug use: Never    No Known Allergies  Current Outpatient Medications  Medication Sig Dispense Refill  . amLODipine (NORVASC) 10 MG tablet Take 1 tablet (10 mg total) by mouth daily. 30 tablet 0  . clobetasol ointment (TEMOVATE) 0.05 % Apply topically 2 (two) times daily as needed. Avoid contact with face/genitals. Cycle use 1 week on, 1 week off 15 g 1  . ergocalciferol (VITAMIN D2) 1.25 MG (50000 UT) capsule Take 1 capsule (50,000 Units total) by mouth once a week. 8 capsule 0  . ferrous sulfate 325 (65 FE) MG EC tablet Take 1 tab by mouth three times daily with a meal every other day 90 tablet 11  . triamcinolone cream (KENALOG) 0.1 % Apply topically 2 (two) times daily as needed.  Avoid contact with face/genitals. Cycle use 1 week on, 1 week off 45 g 1  . vitamin B-12 (CYANOCOBALAMIN) 250 MCG tablet Take 250 mcg by mouth daily.     No current facility-administered medications for this visit.     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 Her deliveries were vaginal.  Blood pressure 130/85, pulse 91, resp. rate 16, height 5\' 4"  (1.626 m), weight 156 lb (70.8 kg), last menstrual period 05/07/2018.  Physical Exam Physical Exam Breathing, conversing, and ambulating normally Well nourished, well hydrated Black female, no apparent distress Speculum exam reveals a 3 x 2 cm mass arising from the 1 o'clock position of her ectocervix. When I grabbed it with ring forceps, it was solid, and was friable. Data Reviewed labs  Assessment DUB with anemia cerivcal polyp  Plan Gyn u/s TDAP today Come back for results If she does not end up needing a hysterectomy, then I will plan on removing the cervical polyp with the LEEP machine at her next visit.    Wynter Grave C Gryphon Vanderveen 05/13/2018, 10:30 AM

## 2018-05-18 ENCOUNTER — Other Ambulatory Visit: Payer: Self-pay | Admitting: Physician Assistant

## 2018-05-18 DIAGNOSIS — I10 Essential (primary) hypertension: Secondary | ICD-10-CM

## 2018-05-31 ENCOUNTER — Encounter: Payer: Self-pay | Admitting: Physician Assistant

## 2018-05-31 ENCOUNTER — Ambulatory Visit (INDEPENDENT_AMBULATORY_CARE_PROVIDER_SITE_OTHER): Payer: 59 | Admitting: Physician Assistant

## 2018-05-31 ENCOUNTER — Other Ambulatory Visit: Payer: Self-pay

## 2018-05-31 DIAGNOSIS — N841 Polyp of cervix uteri: Secondary | ICD-10-CM

## 2018-05-31 DIAGNOSIS — M7918 Myalgia, other site: Secondary | ICD-10-CM | POA: Insufficient documentation

## 2018-05-31 DIAGNOSIS — I10 Essential (primary) hypertension: Secondary | ICD-10-CM

## 2018-05-31 DIAGNOSIS — D259 Leiomyoma of uterus, unspecified: Secondary | ICD-10-CM | POA: Insufficient documentation

## 2018-05-31 DIAGNOSIS — E559 Vitamin D deficiency, unspecified: Secondary | ICD-10-CM | POA: Diagnosis not present

## 2018-05-31 DIAGNOSIS — D500 Iron deficiency anemia secondary to blood loss (chronic): Secondary | ICD-10-CM

## 2018-05-31 HISTORY — DX: Polyp of cervix uteri: N84.1

## 2018-05-31 MED ORDER — ERGOCALCIFEROL 1.25 MG (50000 UT) PO CAPS: 50000.0000 [IU] | ORAL_CAPSULE | ORAL | 0 refills | Status: DC

## 2018-05-31 MED ORDER — FERROUS SULFATE 325 (65 FE) MG PO TBEC: DELAYED_RELEASE_TABLET | ORAL | 11 refills | Status: DC

## 2018-05-31 NOTE — Patient Instructions (Signed)
Iron Deficiency Anemia, Adult  Iron deficiency anemia is a condition in which the concentration of red blood cells or hemoglobin in the blood is below normal because of too little iron. Hemoglobin is a substance in red blood cells that carries oxygen to the body's tissues. When the concentration of red blood cells or hemoglobin is too low, not enough oxygen reaches these tissues.  Iron deficiency anemia is usually long-lasting (chronic) and it develops over time. It may or may not cause symptoms. It is a common type of anemia.  What are the causes?  This condition may be caused by:   Not enough iron in the diet.   Blood loss caused by bleeding in the intestine.   Blood loss from a gastrointestinal condition like Crohn disease.   Frequent blood draws, such as from blood donation.   Abnormal absorption in the gut.   Heavy menstrual periods in women.   Cancers of the gastrointestinal system, such as colon cancer.  What are the signs or symptoms?  Symptoms of this condition may include:   Fatigue.   Headache.   Pale skin, lips, and nail beds.   Poor appetite.   Weakness.   Shortness of breath.   Dizziness.   Cold hands and feet.   Fast or irregular heartbeat.   Irritability. This is more common in severe anemia.   Rapid breathing. This is more common in severe anemia.  Mild anemia may not cause any symptoms.  How is this diagnosed?  This condition is diagnosed based on:   Your medical history.   A physical exam.   Blood tests.  You may have additional tests to find the underlying cause of your anemia, such as:   Testing for blood in the stool (fecal occult blood test).   A procedure to see inside your colon and rectum (colonoscopy).   A procedure to see inside your esophagus and stomach (endoscopy).   A test in which cells are removed from bone marrow (bone marrow aspiration) or fluid is removed from the bone marrow to be examined (biopsy). This is rarely needed.  How is this treated?  This  condition is treated by correcting the cause of your iron deficiency. Treatment may involve:   Adding iron-rich foods to your diet.   Taking iron supplements. If you are pregnant or breastfeeding, you may need to take extra iron because your normal diet usually does not provide the amount of iron that you need.   Increasing vitamin C intake. Vitamin C helps your body absorb iron. Your health care provider may recommend that you take iron supplements along with a glass of orange juice or a vitamin C supplement.   Medicines to make heavy menstrual flow lighter.   Surgery.  You may need repeat blood tests to determine whether treatment is working. Depending on the underlying cause, the anemia should be corrected within 2 months of starting treatment. If the treatment does not seem to be working, you may need more testing.  Follow these instructions at home:  Medicines   Take over-the-counter and prescription medicines only as told by your health care provider. This includes iron supplements and vitamins.   If you cannot tolerate taking iron supplements by mouth, talk with your health care provider about taking them through a vein (intravenously) or an injection into a muscle.   For the best iron absorption, you should take iron supplements when your stomach is empty. If you cannot tolerate them on an empty stomach,   you may need to take them with food.   Do not drink milk or take antacids at the same time as your iron supplements. Milk and antacids may interfere with iron absorption.   Iron supplements can cause constipation. To prevent constipation, include fiber in your diet as told by your health care provider. A stool softener may also be recommended.  Eating and drinking     Talk with your health care provider before changing your diet. He or she may recommend that you eat foods that contain a lot of iron, such as:  ? Liver.  ? Low-fat (lean) beef.  ? Breads and cereals that have iron added to them (are  fortified).  ? Eggs.  ? Dried fruit.  ? Dark green, leafy vegetables.   To help your body use the iron from iron-rich foods, eat those foods at the same time as fresh fruits and vegetables that are high in vitamin C. Foods that are high in vitamin C include:  ? Oranges.  ? Peppers.  ? Tomatoes.  ? Mangoes.   Drinkenoughfluid to keep your urine clear or pale yellow.  General instructions   Return to your normal activities as told by your health care provider. Ask your health care provider what activities are safe for you.   Practice good hygiene. Anemia can make you more prone to illness and infection.   Keep all follow-up visits as told by your health care provider. This is important.  Contact a health care provider if:   You feel nauseous or you vomit.   You feel weak.   You have unexplained sweating.   You develop symptoms of constipation, such as:  ? Having fewer than three bowel movements a week.  ? Straining to have a bowel movement.  ? Having stools that are hard, dry, or larger than normal.  ? Feeling full or bloated.  ? Pain in the lower abdomen.  ? Not feeling relief after having a bowel movement.  Get help right away if:   You faint. If this happens, do not drive yourself to the hospital. Call your local emergency services (911 in the [place]   You have chest pain.   You have shortness of breath that:  ? Is severe.  ? Gets worse with physical activity.   You have a rapid heartbeat.   You become light-headed when getting up from a sitting or lying down position.  This information is not intended to replace advice given to you by your health care provider. Make sure you discuss any questions you have with your health care provider.  Document Released: 03/03/2000 Document Revised: 11/24/2015 Document Reviewed: 11/24/2015  Elsevier Interactive Patient Education  2019 [employer] Inc[employer]

## 2018-05-31 NOTE — Progress Notes (Signed)
HPI:                                                                Marissa Chaney is a 43 y.o. female who presents to [facility]: [facility] today for medication management  HTN: taking Amlodipine 10 mg daily. Compliant with medications. Checks BP's at home. BP range 130's/high 70's - low 80's. Denies vision change, headache, chest pain with exertion, orthopnea, lightheadedness, syncope and edema. Risk factors include: none   Sarcoidosis - she was evaluated by Rheum on 05/13/18. They repeated some lab work. Felt like her fatigue and myalgias are myofascial. Recommended PFT's to assess for pulmonary sarcoidosis. She has a follow-up appt in June.  Iron deficiency anemia: Did not start iron supplementation.  Forgot to pick up supplement at the pharmacy.  Continues to endorse fatigue.  Denies dyspnea on exertion.  She was recently evaluated by gynecology for postcoital bleeding and menorrhagia.  She was found to have a cervical polyp as well as multiple uterine fibroids.  Past Medical History:  Diagnosis Date  . Cervical polyp 05/31/2018  . Hypertension   . Iron deficiency anemia 04/29/2018  . Sarcoidosis    Past Surgical History:  Procedure Laterality Date  . NO PAST SURGERIES     Social History   Tobacco Use  . Smoking status: Current Every Day Smoker    Types: Cigarettes  . Smokeless tobacco: Never Used  . Tobacco comment: 1-2 cig/day  Substance Use Topics  . Alcohol use: Yes    Alcohol/week: 0.0 - 1.0 standard drinks   family history includes Hypertension in her mother.    ROS:  Constitutional: Positive for chills, diaphoresis and malaise/fatigue.  Eyes:       + vision change  Gastrointestinal: Positive for nausea.  Musculoskeletal: Positive for back pain, joint pain and myalgias.  Skin: Positive for itching and rash.  Neurological: Positive for weakness and headaches.  Psychiatric/Behavioral: Positive for depression. The patient is  nervous/anxious and has insomnia.     Medications: Current Outpatient Medications  Medication Sig Dispense Refill  . amLODipine (NORVASC) 10 MG tablet TAKE 1 TABLET BY MOUTH EVERY DAY 90 tablet 0  . clobetasol ointment (TEMOVATE) 0.05 % Apply topically 2 (two) times daily as needed. Avoid contact with face/genitals. Cycle use 1 week on, 1 week off 15 g 1  . ferrous sulfate 325 (65 FE) MG EC tablet Take 1 tab by mouth three times daily with a meal every other day 90 tablet 11  . triamcinolone cream (KENALOG) 0.1 % Apply topically 2 (two) times daily as needed. Avoid contact with face/genitals. Cycle use 1 week on, 1 week off 45 g 1  . vitamin B-12 (CYANOCOBALAMIN) 250 MCG tablet Take 250 mcg by mouth daily.     No current facility-administered medications for this visit.    No Known Allergies     Objective:  BP 131/83   Pulse 82   Wt 158 lb (71.7 kg)   LMP 05/07/2018 (Exact Date)   BMI 27.12 kg/m  Gen:  alert, not ill-appearing, no distress, appropriate for age 89: head normocephalic without obvious abnormality, conjunctiva and cornea clear, trachea midline Pulm: Normal work of breathing, normal phonation Neuro: alert and oriented x 3, no tremor  MSK: extremities atraumatic, normal gait and station Skin: intact, no rashes on exposed skin, no jaundice, no cyanosis Psych: well-groomed, cooperative, good eye contact, euthymic mood, affect mood-congruent, speech is articulate, and thought processes clear and goal-directed   BP Readings from Last 3 Encounters:  05/31/18 131/83  05/13/18 130/85  04/26/18 139/90   Lab Results  Component Value Date   CREATININE 0.75 04/26/2018   BUN 8 04/26/2018   NA 139 04/26/2018   K 4.2 04/26/2018   CL 106 04/26/2018   CO2 26 04/26/2018   Lab Results  Component Value Date   CALCIUM 10.5 (H) 04/26/2018   Lab Results  Component Value Date   ALT 17 04/26/2018   AST 23 04/26/2018   BILITOT 0.3 04/26/2018   Lab Results  Component  Value Date   WBC 3.5 (L) 04/12/2018   HGB 11.3 (L) 04/12/2018   HCT 34.2 (L) 04/12/2018   MCV 93.4 04/12/2018   PLT 301 04/12/2018   Lab Results  Component Value Date   IRON 58 04/26/2018   TIBC 406 04/26/2018   FERRITIN 8 (L) 04/26/2018    No results found for this or any previous visit (from the past 72 hour(s)). No results found.    Assessment and Plan: 43 y.o. female with   .Jeslie was seen today for hypertension.  Diagnoses and all orders for this visit:  Hypercalcemia -     PTH, Intact and Calcium  Vitamin D deficiency -     Discontinue: ergocalciferol (VITAMIN D2) 1.25 MG (50000 UT) capsule; Take 1 capsule (50,000 Units total) by mouth once a week.  Cervical polyp  Uterine leiomyoma, unspecified location  Hypertension goal BP (blood pressure) < 130/80  Iron deficiency anemia due to chronic blood loss -     ferrous sulfate 325 (65 FE) MG EC tablet; Take 1 tab by mouth twice daily with a meal every other day  Myofascial pain    Hypertension Blood pressure nearly at goal with amlodipine 10 mg Encouraged her to continue working on exercise and weight loss Continue current medications Follow-up in 6 months  Iron deficiency anemia Due to chronic blood loss from menorrhagia She had not started recommended iron supplementation Start ferrous sulfate 325 mg twice daily with a meal every other day  Vitamin D deficiency, hypercalcemia Personally reviewed lab results in care everywhere from May 14, 2018 showing active vitamin D level of 119 and increased ionized calcium of 5.85 This may be the cause of the hypercalcemia Recommended she discontinue vitamin D supplementation and recheck PTH and calcium in about 1 month  Uterine fibroids and cervical polyp She will schedule follow-up with gynecology  History of sarcoidosis She will schedule an appointment with ophthalmology for her eye symptoms and with dermatology for her rash Keep follow-up appointment  with rheumatology  Patient education and anticipatory guidance given Patient agrees with treatment plan Follow-up in 6 months or sooner as needed if symptoms worsen or fail to improve  Darlyne Russian PA-C

## 2018-06-11 ENCOUNTER — Other Ambulatory Visit: Payer: Self-pay | Admitting: Physician Assistant

## 2018-06-11 DIAGNOSIS — E559 Vitamin D deficiency, unspecified: Secondary | ICD-10-CM

## 2018-07-03 LAB — PTH, INTACT AND CALCIUM
Calcium: 10.2 mg/dL (ref 8.6–10.2)
PTH: 44 pg/mL (ref 14–64)

## 2018-08-11 ENCOUNTER — Other Ambulatory Visit: Payer: Self-pay | Admitting: Physician Assistant

## 2018-08-11 DIAGNOSIS — I10 Essential (primary) hypertension: Secondary | ICD-10-CM

## 2018-09-30 DIAGNOSIS — L243 Irritant contact dermatitis due to cosmetics: Secondary | ICD-10-CM | POA: Insufficient documentation

## 2018-09-30 DIAGNOSIS — H524 Presbyopia: Secondary | ICD-10-CM | POA: Insufficient documentation

## 2018-09-30 DIAGNOSIS — H5213 Myopia, bilateral: Secondary | ICD-10-CM | POA: Insufficient documentation

## 2018-09-30 DIAGNOSIS — H04123 Dry eye syndrome of bilateral lacrimal glands: Secondary | ICD-10-CM | POA: Insufficient documentation

## 2018-09-30 DIAGNOSIS — H52223 Regular astigmatism, bilateral: Secondary | ICD-10-CM | POA: Insufficient documentation

## 2018-11-11 ENCOUNTER — Ambulatory Visit: Payer: 59 | Admitting: Obstetrics & Gynecology

## 2018-11-21 ENCOUNTER — Ambulatory Visit (INDEPENDENT_AMBULATORY_CARE_PROVIDER_SITE_OTHER): Payer: 59 | Admitting: Obstetrics & Gynecology

## 2018-11-21 ENCOUNTER — Encounter: Payer: Self-pay | Admitting: Obstetrics & Gynecology

## 2018-11-21 ENCOUNTER — Other Ambulatory Visit: Payer: Self-pay

## 2018-11-21 VITALS — BP 121/79 | HR 100 | Resp 16 | Ht 64.0 in | Wt 164.0 lb

## 2018-11-21 DIAGNOSIS — N841 Polyp of cervix uteri: Secondary | ICD-10-CM | POA: Diagnosis not present

## 2018-11-21 DIAGNOSIS — R5382 Chronic fatigue, unspecified: Secondary | ICD-10-CM | POA: Diagnosis not present

## 2018-11-21 NOTE — Progress Notes (Signed)
   Subjective:    Patient ID: Marissa Chaney, female    DOB: 1975/07/15, 43 y.o.   MRN: [id number]  HPI 43 yo married P2 here to have a cervical polyp removed as well as discuss her desire for a hysterectomy. She has irregular periods as well as pelvic heaviness and discomfort. Her u/s showed fibroids with some submucosal components. Her endometrium was 3 mm. At her last visit I was unable to remove the polyp due to its size and told her that I would need the LEEP machine.   Review of Systems She rarely has sex, the last time being about 2 months ago. She reports a normal pap smear 2 years ago.     Objective:   Physical Exam  Breathing, conversing, and ambulating normally Well nourished, well hydrated Black female, no apparent distress Bimanual exam reveals a 12 week size mobile uterus, relatively wide pubic arch (She has had 2 vaginal deliveries). 3 cm cervical polyp removed by cutting the base with the LEEP machine, set at 50 watts. I prepped the area with betadine, Hurricane spray, and 10 cc of 1% lidocaine. I used Monsel's solution after cautery and hemostasis was achieved. She tolerated the procedure well.      Assessment & Plan:  Cervical polyp - now removed.  - rec pelvic rest for a week Symptomatic fibroids- check cbc today I will send Jordan a message to schedule a TVH/BS.  She understands the risks of surgery, including, but not to infection, bleeding, DVTs, damage to bowel, bladder, ureters. She wishes to proceed. Check CBC today

## 2018-11-22 LAB — CBC
HCT: 36.4 % (ref 35.0–45.0)
Hemoglobin: 11.9 g/dL (ref 11.7–15.5)
MCH: 31.3 pg (ref 27.0–33.0)
MCHC: 32.7 g/dL (ref 32.0–36.0)
MCV: 95.8 fL (ref 80.0–100.0)
MPV: 10.3 fL (ref 7.5–12.5)
Platelets: 317 10*3/uL (ref 140–400)
RBC: 3.8 10*6/uL (ref 3.80–5.10)
RDW: 12.8 % (ref 11.0–15.0)
WBC: 6.1 10*3/uL (ref 3.8–10.8)

## 2018-11-24 ENCOUNTER — Other Ambulatory Visit: Payer: Self-pay | Admitting: Physician Assistant

## 2018-11-24 DIAGNOSIS — I10 Essential (primary) hypertension: Secondary | ICD-10-CM

## 2018-12-05 NOTE — Progress Notes (Signed)
[id number] - [place], [place] [address] Phone: [phone] Fax: [phone]      Your procedure is scheduled on September 22nd.  Report to [facility] Main Entrance "A" at 8:45 A.M., and check in at the Admitting office.  Call this number if you have problems the morning of surgery:  [phone]  Call [phone] if you have any questions prior to your surgery date Monday-Friday 8am-4pm    Remember:  Do not eat after midnight the night before your surgery  You may drink clear liquids until 7:45 AM the morning of your surgery.   Clear liquids allowed are: Water, Non-Citrus Juices (without pulp), Carbonated Beverages, Clear Tea, Black Coffee Only, and Gatorade    Take these medicines the morning of surgery with A SIP OF WATER   Amlodipine (Norvasc)    7 days prior to surgery STOP taking any Aspirin (unless otherwise instructed by your surgeon), Aleve, Naproxen, Ibuprofen, Motrin, Advil, Goody's, BC's, all herbal medications, fish oil, and all vitamins.    The Morning of Surgery  Do not wear jewelry, make-up or nail polish.  Do not wear lotions, powders, or perfumes, or deodorant  Do not shave 48 hours prior to surgery.    Do not bring valuables to the hospital.  [facility] is not responsible for any belongings or valuables.  If you are a smoker, DO NOT Smoke 24 hours prior to surgery IF you wear a CPAP at night please bring your mask, tubing, and machine the morning of surgery   Remember that you must have someone to transport you home after your surgery, and remain with you for 24 hours if you are discharged the same day.   Contacts, glasses, hearing aids, dentures or bridgework may not be worn into surgery.    Leave your suitcase in the car.  After surgery it may be brought to your room.  For patients admitted to the hospital, discharge time will be determined by your treatment team.  Patients  discharged the day of surgery will not be allowed to drive home.    Special instructions:   [facility]- Preparing For Surgery  Before surgery, you can play an important role. Because skin is not sterile, your skin needs to be as free of germs as possible. You can reduce the number of germs on your skin by washing with CHG (chlorahexidine gluconate) Soap before surgery.  CHG is an antiseptic cleaner which kills germs and bonds with the skin to continue killing germs even after washing.    Oral Hygiene is also important to reduce your risk of infection.  Remember - BRUSH YOUR TEETH THE MORNING OF SURGERY WITH YOUR REGULAR TOOTHPASTE  Please do not use if you have an allergy to CHG or antibacterial soaps. If your skin becomes reddened/irritated stop using the CHG.  Do not shave (including legs and underarms) for at least 48 hours prior to first CHG shower. It is OK to shave your face.  Please follow these instructions carefully.   1. Shower the NIGHT BEFORE SURGERY and the MORNING OF SURGERY with CHG Soap.   2. If you chose to wash your hair, wash your hair first as usual with your normal shampoo.  3. After you shampoo, rinse your hair and body thoroughly to remove the shampoo.  4. Use CHG as you would any other liquid soap. You can apply CHG directly to the skin and wash gently with a scrungie  or a clean washcloth.   5. Apply the CHG Soap to your body ONLY FROM THE NECK DOWN.  Do not use on open wounds or open sores. Avoid contact with your eyes, ears, mouth and genitals (private parts). Wash Face and genitals (private parts)  with your normal soap.   6. Wash thoroughly, paying special attention to the area where your surgery will be performed.  7. Thoroughly rinse your body with warm water from the neck down.  8. DO NOT shower/wash with your normal soap after using and rinsing off the CHG Soap.  9. Pat yourself dry with a CLEAN TOWEL.  10. Wear CLEAN PAJAMAS to bed the night before  surgery, wear comfortable clothes the morning of surgery  11. Place CLEAN SHEETS on your bed the night of your first shower and DO NOT SLEEP WITH PETS.    Day of Surgery:  Do not apply any deodorants/lotions. Please shower the morning of surgery with the CHG soap  Please wear clean clothes to the hospital/surgery center.   Remember to brush your teeth WITH YOUR REGULAR TOOTHPASTE.   Please read over the following fact sheets that you were given.

## 2018-12-06 ENCOUNTER — Encounter (HOSPITAL_COMMUNITY): Payer: Self-pay

## 2018-12-06 ENCOUNTER — Ambulatory Visit (INDEPENDENT_AMBULATORY_CARE_PROVIDER_SITE_OTHER): Payer: 59 | Admitting: Physician Assistant

## 2018-12-06 ENCOUNTER — Encounter: Payer: Self-pay | Admitting: Physician Assistant

## 2018-12-06 ENCOUNTER — Other Ambulatory Visit: Payer: Self-pay

## 2018-12-06 ENCOUNTER — Ambulatory Visit (INDEPENDENT_AMBULATORY_CARE_PROVIDER_SITE_OTHER): Payer: 59

## 2018-12-06 ENCOUNTER — Encounter (HOSPITAL_COMMUNITY)
Admission: RE | Admit: 2018-12-06 | Discharge: 2018-12-06 | Disposition: A | Discharge status: Home / Self Care | Payer: 59 | Source: Ambulatory Visit | Attending: Obstetrics & Gynecology | Admitting: Obstetrics & Gynecology

## 2018-12-06 ENCOUNTER — Other Ambulatory Visit (HOSPITAL_COMMUNITY)
Admission: RE | Admit: 2018-12-06 | Discharge: 2018-12-06 | Disposition: A | Discharge status: Home / Self Care | Payer: 59 | Source: Ambulatory Visit | Attending: Obstetrics & Gynecology | Admitting: Obstetrics & Gynecology

## 2018-12-06 VITALS — BP 128/82 | HR 84 | Temp 98.4°F | Wt 165.0 lb

## 2018-12-06 DIAGNOSIS — M5417 Radiculopathy, lumbosacral region: Secondary | ICD-10-CM | POA: Diagnosis not present

## 2018-12-06 DIAGNOSIS — I10 Essential (primary) hypertension: Secondary | ICD-10-CM | POA: Diagnosis not present

## 2018-12-06 DIAGNOSIS — Z01818 Encounter for other preprocedural examination: Secondary | ICD-10-CM | POA: Insufficient documentation

## 2018-12-06 LAB — TYPE AND SCREEN
ABO/RH(D): O NEG
Antibody Screen: NEGATIVE

## 2018-12-06 LAB — BASIC METABOLIC PANEL
Anion gap: 7 (ref 5–15)
BUN: <5 mg/dL — ABNORMAL LOW (ref 6–20)
CO2: 23 mmol/L (ref 22–32)
Calcium: 9.7 mg/dL (ref 8.9–10.3)
Chloride: 107 mmol/L (ref 98–111)
Creatinine, Ser: 0.65 mg/dL (ref 0.44–1.00)
GFR calc Af Amer: >60 mL/min (ref >60)
GFR calc non Af Amer: >60 mL/min (ref >60)
Glucose, Bld: 90 mg/dL (ref 70–99)
Potassium: 3.9 mmol/L (ref 3.5–5.1)
Sodium: 137 mmol/L (ref 135–145)

## 2018-12-06 LAB — CBC
HCT: 40.3 % (ref 36.0–46.0)
Hemoglobin: 12.7 g/dL (ref 12.0–15.0)
MCH: 31.8 pg (ref 26.0–34.0)
MCHC: 31.5 g/dL (ref 30.0–36.0)
MCV: 101 fL — ABNORMAL HIGH (ref 80.0–100.0)
Platelets: 280 10*3/uL (ref 150–400)
RBC: 3.99 MIL/uL (ref 3.87–5.11)
RDW: 12.9 % (ref 11.5–15.5)
WBC: 4.4 10*3/uL (ref 4.0–10.5)
nRBC: 0 % (ref 0.0–0.2)

## 2018-12-06 LAB — ABO/RH: ABO/RH(D): O NEG

## 2018-12-06 NOTE — Patient Instructions (Addendum)
Downstairs for X-ray today   Lumbosacral Radiculopathy Lumbosacral radiculopathy is a condition that involves the spinal nerves and nerve roots in the low back and bottom of the spine. The condition develops when these nerves and nerve roots move out of place or become inflamed and cause symptoms. What are the causes? This condition may be caused by:  Pressure from a disk that bulges out of place (herniated disk). A disk is a plate of soft cartilage that separates bones in the spine.  Disk changes that occur with age (disk degeneration).  A narrowing of the bones of the lower back (spinal stenosis).  A tumor.  An infection.  An injury that places sudden pressure on the disks that cushion the bones of your lower spine. What increases the risk? You are more likely to develop this condition if:  You are a female who is 14-78 years old.  You are a female who is 56-69 years old.  You use improper technique when lifting things.  You are overweight or live a sedentary lifestyle.  Your work requires frequent lifting.  You smoke.  You do repetitive activities that strain the spine. What are the signs or symptoms? Symptoms of this condition include:  Pain that goes down from your back into your legs (sciatica), usually on one side of the body. This is the most common symptom. The pain may be worse with sitting, coughing, or sneezing.  Pain and numbness in your legs.  Muscle weakness.  Tingling.  Loss of bladder control or bowel control. How is this diagnosed? This condition may be diagnosed based on:  Your symptoms and medical history.  A physical exam. If the pain is lasting, you may have tests, such as:  MRI scan.  X-ray.  CT scan.  A type of X-ray used to examine the spinal canal after injecting a dye into your spine (myelogram).  A test to measure how electrical impulses move through a nerve (nerve conduction study). How is this treated? Treatment may depend  on the cause of the condition and may include:  Working with a physical therapist.  Taking pain medicine.  Applying heat and ice to affected areas.  Doing stretches to improve flexibility.  Doing exercises to strengthen back muscles.  Having chiropractic spinal manipulation.  Using transcutaneous electrical nerve stimulation (TENS) therapy.  Getting a steroid injection in the spine. In some cases, no treatment is needed. If the condition is long-lasting (chronic), or if symptoms are severe, treatment may involve surgery or lifestyle changes, such as following a weight-loss plan. Follow these instructions at home: Activity  Avoid bending and other activities that make the problem worse.  Maintain a proper position when standing or sitting: ? When standing, keep your upper back and neck straight, with your shoulders pulled back. Avoid slouching. ? When sitting, keep your back straight and relax your shoulders. Do not round your shoulders or pull them backward.  Do not sit or stand in one place for long periods of time.  Take brief periods of rest throughout the day. This will reduce your pain. It is usually better to rest by lying down or standing, not sitting.  When you are resting for longer periods, mix in some mild activity or stretching between periods of rest. This will help to prevent stiffness and pain.  Get regular exercise. Ask your health care provider what activities are safe for you. If you were shown how to do any exercises or stretches, do them as directed by  your health care provider.  Do not lift anything that is heavier than 10 lb (4.5 kg) or the limit that you are told by your health care provider. Always use proper lifting technique, which includes: ? Bending your knees. ? Keeping the load close to your body. ? Avoiding twisting. Managing pain  If directed, put ice on the affected area: ? Put ice in a plastic bag. ? Place a towel between your skin and the  bag. ? Leave the ice on for 20 minutes, 2-3 times a day.  If directed, apply heat to the affected area as often as told by your health care provider. Use the heat source that your health care provider recommends, such as a moist heat pack or a heating pad. ? Place a towel between your skin and the heat source. ? Leave the heat on for 20-30 minutes. ? Remove the heat if your skin turns bright red. This is especially important if you are unable to feel pain, heat, or cold. You may have a greater risk of getting burned.  Take over-the-counter and prescription medicines only as told by your health care provider. General instructions  Sleep on a firm mattress in a comfortable position. Try lying on your side with your knees slightly bent. If you lie on your back, put a pillow under your knees.  Do not drive or use heavy machinery while taking prescription pain medicine.  If your health care provider prescribed a diet or exercise program, follow it as directed.  Keep all follow-up visits as told by your health care provider. This is important. Contact a health care provider if:  Your pain does not improve over time, even when taking pain medicines. Get help right away if:  You develop severe pain.  Your pain suddenly gets worse.  You develop increasing weakness in your legs.  You lose the ability to control your bladder or bowel.  You have difficulty walking or balancing.  You have a fever. Summary  Lumbosacral radiculopathy is a condition that occurs when the spinal nerves and nerve roots in the lower part of the spine move out of place or become inflamed and cause symptoms.  Symptoms include pain, numbness, and tingling that go down from your back into your legs (sciatica), muscle weakness, and loss of bladder control or bowel control.  If directed, apply ice or heat to the affected area as told by your health care provider.  Follow instructions about activity, rest, and proper  lifting technique. This information is not intended to replace advice given to you by your health care provider. Make sure you discuss any questions you have with your health care provider. Document Released: 03/06/2005 Document Revised: 02/22/2017 Document Reviewed: 02/22/2017 Elsevier Patient Education  [place].

## 2018-12-06 NOTE — Progress Notes (Signed)
PCP - Dr. Ottis Stain Cardiologist - denies Rheumatologist - Dr. Lanice Schwab  Chest x-ray - denies EKG - 12/06/2018 Stress Test - denies ECHO - denies  Cardiac Cath - denies  Sleep Study - denies CPAP - N/A  Blood Thinner Instructions: N/A Aspirin Instructions: N/A  ERAS Protcol - YES PRE-SURGERY Ensure - None ordered  COVID TEST- 12/06/2018  Anesthesia review: No  Patient denies shortness of breath, fever, cough and chest pain at PAT appointment  Patient verbalized understanding of instructions that were given to them at the PAT appointment. Patient was also instructed that they will need to review over the PAT instructions again at home before surgery.

## 2018-12-06 NOTE — Progress Notes (Signed)
HPI:                                                                Marissa Chaney is a 43 y.o. female who presents to [facility] today for follow-up  Reports sensation of a pinched nerve in her left buttock with shooting pain that radiates to the lateral ankle x 2 months. She has not had much low back pain. Reports she has to sleep with her knee and hip flexed to relieve the pain in her leg. She has noticed mild weakness in her left leg compared to her right leg. Denies foot drop or gait disturbance. Denies bowel/bladder dysfunction. Denies altered sensation/paresthesias. Denies constitutional symptoms. No prior back trauma or known injury. She had similar symptoms in February that resolved on their own. She did not have x-ray done at that time because symptoms resolved.  HTN: taking Amlodipine 10 mg daily. Compliant with medications. Checks BP's at home. BP range 130's/high 70's - low 80's. Denies vision change, headache, chest pain with exertion, orthopnea, lightheadedness, syncope and edema. Risk factors include: none  She had a cervical polyp removed on 11/21/18. She is having an elective hysterectomy on 12/10/18 for fibroids  Depression screen [facility] 2/9 12/06/2018 04/26/2018  Decreased Interest 0 3  Down, Depressed, Hopeless 0 1  PHQ - 2 Score 0 4  Altered sleeping 0 3  Tired, decreased energy 1 3  Change in appetite 0 0  Feeling bad or failure about yourself  0 0  Trouble concentrating 0 1  Moving slowly or fidgety/restless 0 1  Suicidal thoughts 0 0  PHQ-9 Score 1 12    GAD 7 : Generalized Anxiety Score 12/06/2018 04/26/2018  Nervous, Anxious, on Edge 1 1  Control/stop worrying 1 1  Worry too much - different things 0 1  Trouble relaxing 0 1  Restless 0 0  Easily annoyed or irritable 0 1  Afraid - awful might happen 0 1  Total GAD 7 Score 2 6      Past Medical History:  Diagnosis Date  . Cervical polyp 05/31/2018  .  Hypertension   . Iron deficiency anemia 04/29/2018  . Sarcoidosis    Past Surgical History:  Procedure Laterality Date  . NO PAST SURGERIES     Social History   Tobacco Use  . Smoking status: Current Some Day Smoker    Types: Cigarettes  . Smokeless tobacco: Never Used  . Tobacco comment: 1-2 cig/day  Substance Use Topics  . Alcohol use: Yes    Alcohol/week: 0.0 - 1.0 standard drinks   family history includes Hypertension in her mother.    ROS: negative except as noted in the HPI  Medications: Current Outpatient Medications  Medication Sig Dispense Refill  . amLODipine (NORVASC) 10 MG tablet TAKE 1 TABLET BY MOUTH EVERY DAY (Patient taking differently: Take 10 mg by mouth daily. ) 90 tablet 0  . clobetasol ointment (TEMOVATE) 0.05 % Apply topically 2 (two) times daily as needed. Avoid contact with face/genitals. Cycle use 1 week on, 1 week off (Patient taking differently: Apply 1 application topically 2 (two) times daily as needed (rash). ) 15 g 1  . erythromycin ophthalmic ointment Place 1 application into both eyes at  bedtime as needed (eye inflammation).    Marland Kitchen ibuprofen (ADVIL) 200 MG tablet Take 400 mg by mouth every 6 (six) hours as needed for headache or moderate pain.    Marland Kitchen triamcinolone cream (KENALOG) 0.1 % Apply topically 2 (two) times daily as needed. Avoid contact with face/genitals. Cycle use 1 week on, 1 week off (Patient taking differently: Apply 1 application topically 2 (two) times daily as needed (rash). ) 45 g 1   No current facility-administered medications for this visit.    Allergies  Allergen Reactions  . Blueberry [Vaccinium Angustifolium] Nausea And Vomiting       Objective:  BP 128/82   Pulse 84   Temp 98.4 F (36.9 C) (Oral)   Wt 165 lb (74.8 kg)   LMP 11/19/2018   BMI 27.46 kg/m   Wt Readings from Last 3 Encounters:  12/06/18 165 lb (74.8 kg)  12/06/18 163 lb 6.4 oz (74.1 kg)  11/21/18 164 lb (74.4 kg)   Temp Readings from Last 3  Encounters:  12/06/18 98.4 F (36.9 C) (Oral)  12/06/18 97.7 F (36.5 C)  04/12/18 98.1 F (36.7 C) (Oral)   BP Readings from Last 3 Encounters:  12/06/18 128/82  12/06/18 123/78  11/21/18 121/79   Pulse Readings from Last 3 Encounters:  12/06/18 84  12/06/18 63  11/21/18 100    Gen:  alert, not ill-appearing, no distress, appropriate for age 71: head normocephalic without obvious abnormality, conjunctiva and cornea clear, trachea midline Pulm: Normal work of breathing, normal phonation, clear to auscultation bilaterally, no wheezes, rales or rhonchi CV: Normal rate, regular rhythm, s1 and s2 distinct, no murmurs, clicks or rubs  Neuro: alert and oriented x 3, no tremor, DTR's intact, sensation grossly intact MSK: extremities atraumatic, left lower extremity strength 4+/5, right lower extremity strength 5/5, normal toe walk, negative SLR, normal gait and station Back: atraumatic, no midline tenderness, left SI joint tenderness Skin: intact, no rashes on exposed skin, no jaundice, no cyanosis Psych: well-groomed, cooperative, good eye contact, euthymic mood, affect mood-congruent, speech is articulate, and thought processes clear and goal-directed    Results for orders placed or performed during the hospital encounter of 12/06/18 (from the past 72 hour(s))  Type and screen     Status: None   Collection Time: 12/06/18  9:55 AM  Result Value Ref Range   ABO/RH(D) O NEG    Antibody Screen NEG    Sample Expiration 12/20/2018,2359    Extend sample reason      NO TRANSFUSIONS OR PREGNANCY IN THE PAST 3 MONTHS Performed at [facility], [address] [place], [place] [address]   ABO/Rh     Status: None   Collection Time: 12/06/18  9:55 AM  Result Value Ref Range   ABO/RH(D)      O NEG Performed at [facility] [address] [place], [place] [id number]   Basic metabolic panel     Status: Abnormal   Collection Time: 12/06/18 10:12 AM  Result Value Ref Range    Sodium 137 135 - 145 mmol/L   Potassium 3.9 3.5 - 5.1 mmol/L   Chloride 107 98 - 111 mmol/L   CO2 23 22 - 32 mmol/L   Glucose, Bld 90 70 - 99 mg/dL   BUN <5 (L) 6 - 20 mg/dL   Creatinine, Ser 0.65 0.44 - 1.00 mg/dL   Calcium 9.7 8.9 - 10.3 mg/dL   GFR calc non Af Amer >60 >60 mL/min  GFR calc Af Amer >60 >60 mL/min   Anion gap 7 5 - 15    Comment: Performed at [facility] [address] [place], [place] [address]  CBC     Status: Abnormal   Collection Time: 12/06/18 10:12 AM  Result Value Ref Range   WBC 4.4 4.0 - 10.5 K/uL   RBC 3.99 3.87 - 5.11 MIL/uL   Hemoglobin 12.7 12.0 - 15.0 g/dL   HCT 40.3 36.0 - 46.0 %   MCV 101.0 (H) 80.0 - 100.0 fL   MCH 31.8 26.0 - 34.0 pg   MCHC 31.5 30.0 - 36.0 g/dL   RDW 12.9 11.5 - 15.5 %   Platelets 280 150 - 400 K/uL   nRBC 0.0 0.0 - 0.2 %    Comment: Performed at [facility], [place] [address], [place], [place] [address]   No results found.    Assessment and Plan: 43 y.o. female with   .Marnette was seen today for hypertension.  Diagnoses and all orders for this visit:  Left lumbosacral radiculopathy -     Ambulatory referral to Physical Therapy -     DG Si Joints   Lspine and SI X-rays today Given her upcoming surgery on 9/22 will avoid NSAIDs and steroids She will not likely be able to participate in PT after hysterectomy  Patient education and anticipatory guidance given Patient agrees with treatment plan Follow-up with Sports Medicine in 2 weeks or sooer as needed if symptoms worsen or fail to improve  Darlyne Russian PA-C

## 2018-12-07 LAB — NOVEL CORONAVIRUS, NAA (HOSP ORDER, SEND-OUT TO REF LAB; TAT 18-24 HRS): SARS-CoV-2, NAA: NOT DETECTED

## 2018-12-09 NOTE — Anesthesia Preprocedure Evaluation (Addendum)
Anesthesia Evaluation  Patient identified by MRN, date of birth, ID band Patient awake    Reviewed: Allergy & Precautions, NPO status , Patient's Chart, lab work & pertinent test results  Airway Mallampati: II  TM Distance: >3 FB Neck ROM: Full    Dental no notable dental hx. (+) Teeth Intact, Dental Advisory Given   Pulmonary Current Smoker,    Pulmonary exam normal breath sounds clear to auscultation       Cardiovascular Exercise Tolerance: Good hypertension, Pt. on medications and Pt. on home beta blockers negative cardio ROS Normal cardiovascular exam Rhythm:Regular Rate:Normal     Neuro/Psych negative neurological ROS  negative psych ROS   GI/Hepatic negative GI ROS, Neg liver ROS,   Endo/Other  negative endocrine ROS  Renal/GU negative Renal ROS     Musculoskeletal negative musculoskeletal ROS (+)   Abdominal   Peds  Hematology  (+) Blood dyscrasia, anemia , Hgb 12.7   Anesthesia Other Findings   Reproductive/Obstetrics                          Anesthesia Physical Anesthesia Plan  ASA: II  Anesthesia Plan: General   Post-op Pain Management:    Induction: Intravenous  PONV Risk Score and Plan: 3 and Treatment may vary due to age or medical condition, Ondansetron, Dexamethasone and Midazolam  Airway Management Planned: Oral ETT  Additional Equipment: None  Intra-op Plan:   Post-operative Plan: Extubation in OR  Informed Consent: I have reviewed the patients History and Physical, chart, labs and discussed the procedure including the risks, benefits and alternatives for the proposed anesthesia with the patient or authorized representative who has indicated his/her understanding and acceptance.     Dental advisory given  Plan Discussed with: CRNA and Anesthesiologist  Anesthesia Plan Comments:         Anesthesia Quick Evaluation

## 2018-12-10 ENCOUNTER — Encounter (HOSPITAL_COMMUNITY)
Admission: RE | Disposition: A | Discharge status: Home / Self Care | Payer: Self-pay | Source: Home / Self Care | Attending: Obstetrics & Gynecology

## 2018-12-10 ENCOUNTER — Ambulatory Visit (HOSPITAL_COMMUNITY): Payer: 59 | Admitting: Anesthesiology

## 2018-12-10 ENCOUNTER — Other Ambulatory Visit: Payer: Self-pay

## 2018-12-10 ENCOUNTER — Ambulatory Visit (HOSPITAL_COMMUNITY)
Admission: RE | Admit: 2018-12-10 | Discharge: 2018-12-11 | Disposition: A | Discharge status: Home / Self Care | Payer: 59 | Attending: Obstetrics & Gynecology | Admitting: Obstetrics & Gynecology

## 2018-12-10 ENCOUNTER — Encounter (HOSPITAL_COMMUNITY): Payer: Self-pay | Admitting: Anesthesiology

## 2018-12-10 DIAGNOSIS — F17210 Nicotine dependence, cigarettes, uncomplicated: Secondary | ICD-10-CM | POA: Diagnosis not present

## 2018-12-10 DIAGNOSIS — Z9889 Other specified postprocedural states: Secondary | ICD-10-CM

## 2018-12-10 DIAGNOSIS — I10 Essential (primary) hypertension: Secondary | ICD-10-CM | POA: Diagnosis not present

## 2018-12-10 DIAGNOSIS — D259 Leiomyoma of uterus, unspecified: Secondary | ICD-10-CM

## 2018-12-10 HISTORY — PX: VAGINAL HYSTERECTOMY: SHX2639

## 2018-12-10 LAB — POCT PREGNANCY, URINE: Preg Test, Ur: NEGATIVE

## 2018-12-10 SURGERY — HYSTERECTOMY, VAGINAL
Anesthesia: General | Site: Vagina | Laterality: Bilateral

## 2018-12-10 MED ORDER — FENTANYL CITRATE (PF) 100 MCG/2ML IJ SOLN
INTRAMUSCULAR | Status: DC | PRN
  Administered 2018-12-10 (×4): 50 ug via INTRAVENOUS

## 2018-12-10 MED ORDER — CEFAZOLIN SODIUM-DEXTROSE 2-4 GM/100ML-% IV SOLN
2.0000 g | INTRAVENOUS | Status: AC
  Administered 2018-12-10: 2 g via INTRAVENOUS
  Filled 2018-12-10: qty 100

## 2018-12-10 MED ORDER — PROPOFOL 10 MG/ML IV BOLUS
INTRAVENOUS | Status: AC
  Filled 2018-12-10: qty 20

## 2018-12-10 MED ORDER — OXYCODONE-ACETAMINOPHEN 5-325 MG PO TABS: 1.0000 | ORAL_TABLET | Freq: Four times a day (QID) | ORAL | 0 refills | Status: DC | PRN

## 2018-12-10 MED ORDER — PROMETHAZINE HCL 25 MG/ML IJ SOLN: 6.2500 mg | INTRAMUSCULAR | Status: DC | PRN

## 2018-12-10 MED ORDER — FENTANYL CITRATE (PF) 250 MCG/5ML IJ SOLN
INTRAMUSCULAR | Status: AC
  Filled 2018-12-10: qty 5

## 2018-12-10 MED ORDER — BUPIVACAINE-EPINEPHRINE (PF) 0.5% -1:200000 IJ SOLN
INTRAMUSCULAR | Status: AC
  Filled 2018-12-10: qty 30

## 2018-12-10 MED ORDER — LIDOCAINE 2% (20 MG/ML) 5 ML SYRINGE
INTRAMUSCULAR | Status: AC
  Filled 2018-12-10: qty 5

## 2018-12-10 MED ORDER — ONDANSETRON HCL 4 MG/2ML IJ SOLN: 4.0000 mg | Freq: Four times a day (QID) | INTRAMUSCULAR | Status: DC | PRN

## 2018-12-10 MED ORDER — BUPIVACAINE-EPINEPHRINE 0.5% -1:200000 IJ SOLN
INTRAMUSCULAR | Status: DC | PRN
  Administered 2018-12-10: 30 mL

## 2018-12-10 MED ORDER — HYDROMORPHONE HCL 1 MG/ML IJ SOLN
0.2500 mg | INTRAMUSCULAR | Status: DC | PRN
  Administered 2018-12-10: 0.25 mg via INTRAVENOUS
  Administered 2018-12-10 (×2): 0.5 mg via INTRAVENOUS

## 2018-12-10 MED ORDER — ROCURONIUM BROMIDE 10 MG/ML (PF) SYRINGE
PREFILLED_SYRINGE | INTRAVENOUS | Status: DC | PRN
  Administered 2018-12-10: 50 mg via INTRAVENOUS

## 2018-12-10 MED ORDER — ONDANSETRON HCL 4 MG/2ML IJ SOLN
INTRAMUSCULAR | Status: AC
  Filled 2018-12-10: qty 2

## 2018-12-10 MED ORDER — MIDAZOLAM HCL 5 MG/5ML IJ SOLN
INTRAMUSCULAR | Status: DC | PRN
  Administered 2018-12-10: 2 mg via INTRAVENOUS

## 2018-12-10 MED ORDER — DEXAMETHASONE SODIUM PHOSPHATE 10 MG/ML IJ SOLN
INTRAMUSCULAR | Status: AC
  Filled 2018-12-10: qty 1

## 2018-12-10 MED ORDER — HYDROMORPHONE HCL 1 MG/ML IJ SOLN
INTRAMUSCULAR | Status: AC
  Filled 2018-12-10: qty 1

## 2018-12-10 MED ORDER — LACTATED RINGERS IV SOLN: INTRAVENOUS | Status: DC

## 2018-12-10 MED ORDER — DEXAMETHASONE SODIUM PHOSPHATE 10 MG/ML IJ SOLN
INTRAMUSCULAR | Status: DC | PRN
  Administered 2018-12-10: 10 mg via INTRAVENOUS

## 2018-12-10 MED ORDER — AMLODIPINE BESYLATE 10 MG PO TABS
10.0000 mg | ORAL_TABLET | Freq: Every day | ORAL | Status: DC
  Administered 2018-12-11: 10 mg via ORAL
  Filled 2018-12-10 (×2): qty 1

## 2018-12-10 MED ORDER — ACETAMINOPHEN 10 MG/ML IV SOLN
INTRAVENOUS | Status: AC
  Filled 2018-12-10: qty 100

## 2018-12-10 MED ORDER — CHLORHEXIDINE GLUCONATE CLOTH 2 % EX PADS
6.0000 | MEDICATED_PAD | Freq: Every day | CUTANEOUS | Status: DC
  Administered 2018-12-11: 10:00:00 6 via TOPICAL

## 2018-12-10 MED ORDER — IBUPROFEN 800 MG PO TABS
800.0000 mg | ORAL_TABLET | Freq: Three times a day (TID) | ORAL | Status: DC
  Administered 2018-12-10 – 2018-12-11 (×3): 800 mg via ORAL
  Filled 2018-12-10 (×3): qty 1

## 2018-12-10 MED ORDER — SODIUM CHLORIDE (PF) 0.9 % IJ SOLN
INTRAMUSCULAR | Status: DC | PRN
  Administered 2018-12-10: 100 mL

## 2018-12-10 MED ORDER — ONDANSETRON HCL 4 MG/2ML IJ SOLN
INTRAMUSCULAR | Status: DC | PRN
  Administered 2018-12-10: 4 mg via INTRAVENOUS

## 2018-12-10 MED ORDER — LIDOCAINE 2% (20 MG/ML) 5 ML SYRINGE
INTRAMUSCULAR | Status: DC | PRN
  Administered 2018-12-10: 100 mg via INTRAVENOUS

## 2018-12-10 MED ORDER — SODIUM CHLORIDE (PF) 0.9 % IJ SOLN
INTRAMUSCULAR | Status: AC
  Filled 2018-12-10: qty 100

## 2018-12-10 MED ORDER — ACETAMINOPHEN 10 MG/ML IV SOLN
1000.0000 mg | Freq: Once | INTRAVENOUS | Status: DC | PRN
  Administered 2018-12-10: 1000 mg via INTRAVENOUS

## 2018-12-10 MED ORDER — MIDAZOLAM HCL 2 MG/2ML IJ SOLN
INTRAMUSCULAR | Status: AC
  Filled 2018-12-10: qty 2

## 2018-12-10 MED ORDER — PROPOFOL 10 MG/ML IV BOLUS
INTRAVENOUS | Status: DC | PRN
  Administered 2018-12-10: 180 mg via INTRAVENOUS

## 2018-12-10 MED ORDER — IBUPROFEN 600 MG PO TABS: 600.0000 mg | ORAL_TABLET | Freq: Four times a day (QID) | ORAL | 1 refills | Status: DC | PRN

## 2018-12-10 MED ORDER — ONDANSETRON HCL 4 MG PO TABS: 4.0000 mg | ORAL_TABLET | Freq: Four times a day (QID) | ORAL | Status: DC | PRN

## 2018-12-10 MED ORDER — LACTATED RINGERS IV SOLN
INTRAVENOUS | Status: DC
  Administered 2018-12-10 (×2): via INTRAVENOUS

## 2018-12-10 MED ORDER — SUGAMMADEX SODIUM 200 MG/2ML IV SOLN
INTRAVENOUS | Status: DC | PRN
  Administered 2018-12-10: 150 mg via INTRAVENOUS

## 2018-12-10 MED ORDER — HYDROCODONE-ACETAMINOPHEN 7.5-325 MG PO TABS: 1.0000 | ORAL_TABLET | Freq: Once | ORAL | Status: DC | PRN

## 2018-12-10 MED ORDER — MEPERIDINE HCL 25 MG/ML IJ SOLN: 6.2500 mg | INTRAMUSCULAR | Status: DC | PRN

## 2018-12-10 MED ORDER — OXYCODONE-ACETAMINOPHEN 5-325 MG PO TABS: 1.0000 | ORAL_TABLET | ORAL | Status: DC | PRN

## 2018-12-10 SURGICAL SUPPLY — 28 items
CANISTER SUCT 3000ML PPV (MISCELLANEOUS) ×3 IMPLANT
DECANTER SPIKE VIAL GLASS SM (MISCELLANEOUS) ×6 IMPLANT
GAUZE 4X4 16PLY RFD (DISPOSABLE) ×3 IMPLANT
GLOVE BIO SURGEON STRL SZ 6.5 (GLOVE) ×2 IMPLANT
GLOVE BIO SURGEONS STRL SZ 6.5 (GLOVE) ×1
GLOVE BIOGEL PI IND STRL 6.5 (GLOVE) ×1 IMPLANT
GLOVE BIOGEL PI IND STRL 7.0 (GLOVE) ×1 IMPLANT
GLOVE BIOGEL PI IND STRL 8.5 (GLOVE) ×1 IMPLANT
GLOVE BIOGEL PI INDICATOR 6.5 (GLOVE) ×2
GLOVE BIOGEL PI INDICATOR 7.0 (GLOVE) ×2
GLOVE BIOGEL PI INDICATOR 8.5 (GLOVE) ×2
GLOVE NEODERM STER SZ 7 (GLOVE) ×6 IMPLANT
GOWN STRL REUS W/ TWL LRG LVL3 (GOWN DISPOSABLE) ×4 IMPLANT
GOWN STRL REUS W/TWL LRG LVL3 (GOWN DISPOSABLE) ×8
KIT TURNOVER KIT B (KITS) ×3 IMPLANT
NEEDLE MAYO CATGUT SZ4 (NEEDLE) ×3 IMPLANT
NEEDLE SPNL 18GX3.5 QUINCKE PK (NEEDLE) ×3 IMPLANT
NS IRRIG 1000ML POUR BTL (IV SOLUTION) ×3 IMPLANT
PACK VAGINAL WOMENS (CUSTOM PROCEDURE TRAY) ×3 IMPLANT
PAD OB MATERNITY 4.3X12.25 (PERSONAL CARE ITEMS) ×3 IMPLANT
SPECIMEN JAR MEDIUM (MISCELLANEOUS) ×3 IMPLANT
SUT VIC AB 0 CT1 36 (SUTURE) ×3 IMPLANT
SUT VIC AB 2-0 CT1 18 (SUTURE) ×9 IMPLANT
SUT VIC AB 2-0 CT1 27 (SUTURE) ×2
SUT VIC AB 2-0 CT1 TAPERPNT 27 (SUTURE) ×1 IMPLANT
TOWEL GREEN STERILE FF (TOWEL DISPOSABLE) ×6 IMPLANT
TRAY FOLEY W/BAG SLVR 14FR (SET/KITS/TRAYS/PACK) ×3 IMPLANT
UNDERPAD 30X30 (UNDERPADS AND DIAPERS) ×3 IMPLANT

## 2018-12-10 NOTE — Discharge Instructions (Signed)
Vaginal Hysterectomy, Care After °Refer to this sheet in the next few weeks. These instructions provide you with information about caring for yourself after your procedure. Your health care provider may also give you more specific instructions. Your treatment has been planned according to current medical practices, but problems sometimes occur. Call your health care provider if you have any problems or questions after your procedure. °What can I expect after the procedure? °After the procedure, it is common to have: °· Pain. °· Soreness and numbness in your incision areas. °· Vaginal bleeding and discharge. °· Constipation. °· Temporary problems emptying the bladder. °· Feelings of sadness or other emotions. °Follow these instructions at home: °Medicines °· Take over-the-counter and prescription medicines only as told by your health care provider. °· If you were prescribed an antibiotic medicine, take it as told by your health care provider. Do not stop taking the antibiotic even if you start to feel better. °· Do not drive or operate heavy machinery while taking prescription pain medicine. °Activity °· Return to your normal activities as told by your health care provider. Ask your health care provider what activities are safe for you. °· Get regular exercise as told by your health care provider. You may be told to take short walks every day and go farther each time. °· Do not lift anything that is heavier than 10 lb (4.5 kg). °General instructions ° °· Do not put anything in your vagina for 6 weeks after your surgery or as told by your health care provider. This includes tampons and douches. °· Do not have sex until your health care provider says you can. °· Do not take baths, swim, or use a hot tub until your health care provider approves. °· Drink enough fluid to keep your urine clear or pale yellow. °· Do not drive for 24 hours if you were given a sedative. °· Keep all follow-up visits as told by your health  care provider. This is important. °Contact a health care provider if: °· Your pain medicine is not helping. °· You have a fever. °· You have redness, swelling, or pain at your incision site. °· You have blood, pus, or a bad-smelling discharge from your vagina. °· You continue to have difficulty urinating. °Get help right away if: °· You have severe abdominal or back pain. °· You have heavy bleeding from your vagina. °· You have chest pain or shortness of breath. °This information is not intended to replace advice given to you by your health care provider. Make sure you discuss any questions you have with your health care provider. °Document Released: 06/28/2015 Document Revised: 10/28/2015 Document Reviewed: 03/21/2015 °Elsevier Patient Education © 2020 [employer] ° °

## 2018-12-10 NOTE — Progress Notes (Signed)
Pt received from PACU VSS states minimal pain peri-pad is clean dry and intact.  Abdomen is soft with positive hypoactive bowel sounds. IVF's infusing without difficulty. Pt given cranberry juice and water, denies any nausea. Foley patent and intact will remove in am and ambulate patient. Pt in bed call light in reach.

## 2018-12-10 NOTE — Transfer of Care (Signed)
Immediate Anesthesia Transfer of Care Note  Patient: Marissa Chaney  Procedure(s) Performed: HYSTERECTOMY VAGINAL WITH SALPINGECTOMY (Bilateral Vagina )  Patient Location: PACU  Anesthesia Type:General  Level of Consciousness: oriented, drowsy and patient cooperative  Airway & Oxygen Therapy: Patient Spontanous Breathing and Patient connected to nasal cannula oxygen  Post-op Assessment: Report given to RN and Post -op Vital signs reviewed and stable  Post vital signs: Reviewed  Last Vitals:  Vitals Value Taken Time  BP    Temp    Pulse 85 12/10/18 1149  Resp 17 12/10/18 1149  SpO2 100 % 12/10/18 1149  Vitals shown include unvalidated device data.  Last Pain:  Vitals:   12/10/18 0813  TempSrc: Oral  PainSc: 0-No pain      Patients Stated Pain Goal: 4 ([id number])  Complications: No apparent anesthesia complications

## 2018-12-10 NOTE — Anesthesia Procedure Notes (Signed)
Procedure Name: Intubation Date/Time: 12/10/2018 10:19 AM Performed by: Jenne Campus, CRNA Pre-anesthesia Checklist: Patient identified, Emergency Drugs available, Suction available and Patient being monitored Patient Re-evaluated:Patient Re-evaluated prior to induction Oxygen Delivery Method: Circle System Utilized Preoxygenation: Pre-oxygenation with 100% oxygen Induction Type: IV induction Ventilation: Mask ventilation without difficulty Laryngoscope Size: Miller and 2 Grade View: Grade I Tube type: Oral Tube size: 7.0 mm Number of attempts: 1 Airway Equipment and Method: Stylet and Oral airway Placement Confirmation: ETT inserted through vocal cords under direct vision,  positive ETCO2 and breath sounds checked- equal and bilateral Secured at: 21 cm Tube secured with: Tape Dental Injury: Teeth and Oropharynx as per pre-operative assessment

## 2018-12-10 NOTE — Anesthesia Postprocedure Evaluation (Signed)
Anesthesia Post Note  Patient: Marissa Chaney  Procedure(s) Performed: HYSTERECTOMY VAGINAL WITH SALPINGECTOMY (Bilateral Vagina )     Patient location during evaluation: PACU Anesthesia Type: General Level of consciousness: awake and alert Pain management: pain level controlled Vital Signs Assessment: post-procedure vital signs reviewed and stable Respiratory status: spontaneous breathing, nonlabored ventilation, respiratory function stable and patient connected to nasal cannula oxygen Cardiovascular status: blood pressure returned to baseline and stable Postop Assessment: no apparent nausea or vomiting Anesthetic complications: no    Last Vitals:  Vitals:   12/10/18 1305 12/10/18 1320  BP: 117/83 119/78  Pulse: 67 69  Resp: 12 15  Temp:    SpO2: 100% 100%    Last Pain:  Vitals:   12/10/18 1320  TempSrc:   PainSc: [place] A Houser

## 2018-12-10 NOTE — Discharge Summary (Signed)
Physician Discharge Summary  Patient ID: SOLMAYRA GILLAN MRN: [id number] DOB/AGE: 11/18/1975 43 y.o.  Admit date: 12/10/2018 Discharge date: 12/11/2018  Admission Diagnoses: symptomatic fibroids  Discharge Diagnoses: same Active Problems:   Post-operative state   Discharged Condition: good  Hospital Course: She underwent an uncomplicated TVH/BS. By POD #1 she was ambulating, voiding, and tolerating po well. She voiced her readiness to go home.   Consults: None  Significant Diagnostic Studies: labs: none  Treatments: surgery: TVH/BS  Discharge Exam: Blood pressure 119/80, pulse 64, temperature 98.2 F (36.8 C), temperature source Oral, resp. rate 17, height 5\' 5"  (1.651 m), weight 74.8 kg, last menstrual period 11/19/2018, SpO2 100 %. General appearance: alert Heart- rrr Lungs- CTAB Abd- benign  Disposition: Discharge disposition: 01-Home or Self Care     home   Discharge Instructions    Call MD for:  difficulty breathing, headache or visual disturbances   Complete by: As directed    Call MD for:  persistant nausea and vomiting   Complete by: As directed    Call MD for:  severe uncontrolled pain   Complete by: As directed    Call MD for:  temperature >100.4   Complete by: As directed    Diet - low sodium heart healthy   Complete by: As directed    Increase activity slowly   Complete by: As directed       Follow-up Information    Emily Filbert, MD. Schedule an appointment as soon as possible for a visit in 6 weeks.   Specialty: Obstetrics and Gynecology Contact information: [address] [place] [address] [phone]           Signed: Truett Mainland 12/11/2018, 7:18 AM

## 2018-12-10 NOTE — Op Note (Signed)
12/10/2018  11:36 AM  PATIENT:  Marissa Chaney  43 y.o. female  PRE-OPERATIVE DIAGNOSIS: symptomatic fibroids  POST-OPERATIVE DIAGNOSIS: same  PROCEDURE:  Procedure(s): HYSTERECTOMY VAGINAL WITH SALPINGECTOMY (Bilateral)  SURGEON:  Surgeon(s) and Role:    * Alura Olveda, Wilhemina Cash, MD - Primary   ASSISTANTS: Maudry Diego, MS 3   ANESTHESIA:   local and general  EBL:  300 mL   BLOOD ADMINISTERED:none  DRAINS: Urinary Catheter (Foley)   LOCAL MEDICATIONS USED:  MARCAINE     SPECIMEN:  Source of Specimen:  uterus and tubes  DISPOSITION OF SPECIMEN:  PATHOLOGY  COUNTS:  YES  TOURNIQUET:  * No tourniquets in log *  DICTATION: .Dragon Dictation  PLAN OF CARE: Discharge to home after PACU  PATIENT DISPOSITION:  PACU - hemodynamically stable.   Delay start of Pharmacological VTE agent (>24hrs) due to surgical blood loss or risk of bleeding: not applicable  The risks, benefits, alternatives of surgery were explained, understood, and accepted. All questions were answered and a consent form was signed. She was taken to the operating room and general anesthesia was applied. She was put in the dorsal lithotomy position. Her vagina was prepped and draped in the usual sterile fashion. A timeout procedure was done. A bimanual exam revealed a small retroverted uterus and non-enlarged adnexa. A bladder catheter was placed and it drained clear urine throughout the case. The cervix was grasped with a single-tooth tenaculum. A total of 110 cc of dilute Marcaine was injected in a circumferential fashion at the cervicovaginal junction. An incision was made at the site. The posterior peritoneum was entered. A long weighted speculum was placed. The anterior peritoneum was entered. A Deaver was placed anteriorly. The uterosacral ligaments were clamped, cut, and ligated. They were tagged and held. 2 Vicryl sutures used throughout this case unless otherwise specified. The small uterus was separated from its  pelvic attachments using a similar clamp, cut, ligate technique. Excellent hemostasis was maintained throughout. Due to the size of her uterus, I had to morcelate it to achieve better visualization. Once the uterus was removed, the bowel was kept out of the operative site with a sponge on a stick. I was able to see both ovaries and they appeared normal. I grasped the fimbriated end of each oviduct and grasped them with a Kelley clamp. I excised both. Both pedicles were ligated with 2-0 vicryl suture. Excellent hemostasis was noted. The peritoneum was closed in a purse string fashion.  I used the tags on the  uterosacral ligments to incorportate these into the vaginal angles for vaginal support.  I closed the edges of the vaginal mucosa in a running, locking fashion, closing the entire vaginal cuff in a vertiacal fashion. She was extubated and taken to the recovery room in stable condition.

## 2018-12-10 NOTE — H&P (Signed)
Marissa Chaney is a 43 y.o. married P2 (21 son senior at [employer] and 24 yo daughter) here today with menstrual irregularities for a few years. She is finally putting herself first and getting health care concerns addressed.  Her periods last 7 days and come with no regularity. She may have 2 periods per months. She never skips periods. She has to keep a bag of tampons with her at all times. Her u/s showed a large fibroid uterus.  She does not use contraception, would like a hyterectomy. She has sex about 2 x per month.    Menstrual History: Menarche age: 61 Patient's last menstrual period was 11/19/2018.    Past Medical History:  Diagnosis Date  . Cervical polyp 05/31/2018  . Hypertension   . Iron deficiency anemia 04/29/2018  . Sarcoidosis     Past Surgical History:  Procedure Laterality Date  . NO PAST SURGERIES      Family History  Problem Relation Age of Onset  . Hypertension Mother     Social History:  reports that she has been smoking cigarettes. She has never used smokeless tobacco. She reports current alcohol use. She reports that she does not use drugs.  Allergies:  Allergies  Allergen Reactions  . Blueberry [Vaccinium Angustifolium] Nausea And Vomiting    Medications Prior to Admission  Medication Sig Dispense Refill Last Dose  . amLODipine (NORVASC) 10 MG tablet TAKE 1 TABLET BY MOUTH EVERY DAY (Patient taking differently: Take 10 mg by mouth daily. ) 90 tablet 0 12/10/2018 at 0553  . ibuprofen (ADVIL) 200 MG tablet Take 400 mg by mouth every 6 (six) hours as needed for headache or moderate pain.   Past Month at Unknown time  . clobetasol ointment (TEMOVATE) 0.05 % Apply topically 2 (two) times daily as needed. Avoid contact with face/genitals. Cycle use 1 week on, 1 week off (Patient taking differently: Apply 1 application topically 2 (two) times daily as needed (rash). ) 15 g 1 More than a month at Unknown time  . erythromycin ophthalmic ointment Place 1  application into both eyes at bedtime as needed (eye inflammation).   More than a month at Unknown time  . triamcinolone cream (KENALOG) 0.1 % Apply topically 2 (two) times daily as needed. Avoid contact with face/genitals. Cycle use 1 week on, 1 week off (Patient taking differently: Apply 1 application topically 2 (two) times daily as needed (rash). ) 45 g 1 More than a month at Unknown time    ROS Married for 15 years. She is a [employer] fundraiser. She has pain with sex.  Blood pressure 125/82, pulse 76, temperature 98.3 F (36.8 C), temperature source Oral, resp. rate 18, height 5\' 5"  (1.651 m), weight 74.8 kg, last menstrual period 11/19/2018, SpO2 100 %. Physical Exam Breathing, conversing, and ambulating normally Well nourished, well hydrated Black female, no apparent distress Heart- rrr Lungs- CTAB Abd- benign  Results for orders placed or performed during the hospital encounter of 12/10/18 (from the past 24 hour(s))  Pregnancy, urine POC     Status: None   Collection Time: 12/10/18  8:25 AM  Result Value Ref Range   Preg Test, Ur NEGATIVE NEGATIVE    No results found.  Assessment/Plan: Symptomatic fibroids- plan for TVH/BS  She understands the risks of surgery, including, but not to infection, bleeding, DVTs, damage to bowel, bladder, ureters. She wishes to proceed.     Emily Filbert 12/10/2018, 9:40 AM

## 2018-12-10 NOTE — Plan of Care (Signed)
Pt has foley catheter will be discontinued at 0400 Pt tolerating liquids will D/C IVF's if adequate intake Surgical dressing (peripads) clean dry

## 2018-12-11 ENCOUNTER — Encounter (HOSPITAL_COMMUNITY): Payer: Self-pay | Admitting: Obstetrics & Gynecology

## 2018-12-11 DIAGNOSIS — D259 Leiomyoma of uterus, unspecified: Secondary | ICD-10-CM | POA: Diagnosis not present

## 2018-12-11 LAB — CBC
HCT: 36.7 % (ref 36.0–46.0)
Hemoglobin: 12.4 g/dL (ref 12.0–15.0)
MCH: 32.4 pg (ref 26.0–34.0)
MCHC: 33.8 g/dL (ref 30.0–36.0)
MCV: 95.8 fL (ref 80.0–100.0)
Platelets: 287 10*3/uL (ref 150–400)
RBC: 3.83 MIL/uL — ABNORMAL LOW (ref 3.87–5.11)
RDW: 12.8 % (ref 11.5–15.5)
WBC: 11.6 10*3/uL — ABNORMAL HIGH (ref 4.0–10.5)
nRBC: 0 % (ref 0.0–0.2)

## 2018-12-11 LAB — SURGICAL PATHOLOGY

## 2018-12-11 MED ORDER — DOCUSATE SODIUM 100 MG PO CAPS: 100.0000 mg | ORAL_CAPSULE | Freq: Two times a day (BID) | ORAL | 2 refills | Status: DC | PRN

## 2018-12-11 NOTE — Progress Notes (Signed)
Foley D/C pt tolerated well. Education done regarding post foley care and expectations. Questions answered

## 2018-12-11 NOTE — Progress Notes (Signed)
Patient discharged to home. Verbalized understanding of all discharge instructions including monitor of vaginal discharge, activity restrictions, discharge medications and follow up MD visits.

## 2018-12-18 ENCOUNTER — Encounter: Payer: Self-pay | Admitting: Sports Medicine

## 2018-12-18 ENCOUNTER — Ambulatory Visit (INDEPENDENT_AMBULATORY_CARE_PROVIDER_SITE_OTHER): Payer: 59 | Admitting: Sports Medicine

## 2018-12-18 ENCOUNTER — Other Ambulatory Visit: Payer: Self-pay

## 2018-12-18 ENCOUNTER — Ambulatory Visit (INDEPENDENT_AMBULATORY_CARE_PROVIDER_SITE_OTHER): Payer: 59

## 2018-12-18 DIAGNOSIS — M5416 Radiculopathy, lumbar region: Secondary | ICD-10-CM

## 2018-12-18 MED ORDER — PREDNISONE 50 MG PO TABS: ORAL_TABLET | ORAL | 0 refills | Status: DC

## 2018-12-18 NOTE — Progress Notes (Signed)
Subjective:    I'm seeing this patient as a consultation for: Nelson Chimes, PA-C  CC: Left leg pain  HPI: This is a pleasant 43 year old female, for several months now she is had pain in her back with radiation down the left leg, around the anterolateral aspect of the left thigh to the anterior lower leg.  More recently she had a vaginal hysterectomy, and had worsening of symptoms, bit of leakage of urine.  No fevers, chills.  Symptoms are moderate, persistent.  I reviewed the past medical history, family history, social history, surgical history, and allergies today and no changes were needed.  Please see the problem list section below in epic for further details.  Past Medical History: Past Medical History:  Diagnosis Date  . Cervical polyp 05/31/2018  . Hypertension   . Iron deficiency anemia 04/29/2018  . Sarcoidosis    Past Surgical History: Past Surgical History:  Procedure Laterality Date  . NO PAST SURGERIES    . VAGINAL HYSTERECTOMY Bilateral 12/10/2018   Procedure: HYSTERECTOMY VAGINAL WITH SALPINGECTOMY;  Surgeon: Emily Filbert, MD;  Location: [facility];  Service: Gynecology;  Laterality: Bilateral;   Social History: Social History   Socioeconomic History  . Marital status: Married    Spouse name: Not on file  . Number of children: Not on file  . Years of education: Not on file  . Highest education level: Not on file  Occupational History  . Not on file  Social Needs  . Financial resource strain: Not hard at all  . Food insecurity    Worry: Never true    Inability: Never true  . Transportation needs    Medical: No    Non-medical: No  Tobacco Use  . Smoking status: Current Some Day Smoker    Types: Cigarettes  . Smokeless tobacco: Never Used  . Tobacco comment: 1-2 cig/day  Substance and Sexual Activity  . Alcohol use: Yes    Alcohol/week: 1.0 - 2.0 standard drinks    Types: 1 Glasses of wine per week  . Drug use: Never  . Sexual activity: Yes   Birth control/protection: None  Lifestyle  . Physical activity    Days per week: 0 days    Minutes per session: 0 min  . Stress: Not at all  Relationships  . Social connections    Talks on phone: More than three times a week    Gets together: More than three times a week    Attends religious service: Never    Active member of club or organization: No    Attends meetings of clubs or organizations: Not on file    Relationship status: Married  Other Topics Concern  . Not on file  Social History Narrative  . Not on file   Family History: Family History  Problem Relation Age of Onset  . Hypertension Mother    Allergies: Allergies  Allergen Reactions  . Blueberry [Vaccinium Angustifolium] Nausea And Vomiting   Medications: See med rec.  Review of Systems: No headache, visual changes, nausea, vomiting, diarrhea, constipation, dizziness, abdominal pain, skin rash, fevers, chills, night sweats, weight loss, swollen lymph nodes, body aches, joint swelling, muscle aches, chest pain, shortness of breath, mood changes, visual or auditory hallucinations.   Objective:   General: Well Developed, well nourished, and in no acute distress.  Neuro:  Extra-ocular muscles intact, able to move all 4 extremities, sensation grossly intact.  Deep tendon reflexes tested were normal. Psych: Alert and oriented, mood congruent  with affect. ENT:  Ears and nose appear unremarkable.  Hearing grossly normal. Neck: Unremarkable overall appearance, trachea midline.  No visible thyroid enlargement. Eyes: Conjunctivae and lids appear unremarkable.  Pupils equal and round. Skin: Warm and dry, no rashes noted.  Cardiovascular: Pulses palpable, no extremity edema. Back Exam:  Inspection: Unremarkable  Motion: Flexion 45 deg, Extension 45 deg, Side Bending to 45 deg bilaterally,  Rotation to 45 deg bilaterally  SLR laying: Negative  XSLR laying: Negative  Palpable tenderness: None. FABER: negative. Sensory  change: Gross sensation intact to all lumbar and sacral dermatomes.  Reflexes: 2+ at both patellar tendons, 2+ at achilles tendons, Babinski's downgoing.  Strength at left foot  Plantar-flexion: 4/5 Dorsi-flexion: 5/5 Eversion: 5/5 Inversion: 5/5  Leg strength  Quad: 5/5 Hamstring: 4/5 Hip flexor: 5/5 Hip abductors: 5/5  Gait unremarkable.  Impression and Recommendations:   This case required medical decision making of moderate complexity.  Left lumbar radiculopathy Left lumbar radiculopathy with slight weakness, she is also having a little bit of urine leakage of urine. I do not think this is cauda equina syndrome, she had a vaginal hysterectomy 2 weeks ago, patients are typically placed in the lithotomy position and this extreme flexion of the lumbar spine can also worsen discogenic pain and radicular pain. X-rays, MRI of the lumbar spine, prednisone, formal PT. She will probably need some FMLA paperwork filled out for her PT appointments and visit with me. Return to see me in 4 weeks. If insufficient improvement we also may consider a pelvic MRI to evaluate for injury to the lumbosacral plexus during her vaginal hysterectomy.   ___________________________________________ Gwen Her. Dianah Field, M.D., ABFM., CAQSM. Primary Care and Sports Medicine [facility]  Adjunct Professor of [employer]

## 2018-12-18 NOTE — Assessment & Plan Note (Signed)
Left lumbar radiculopathy with slight weakness, she is also having a little bit of urine leakage of urine. I do not think this is cauda equina syndrome, she had a vaginal hysterectomy 2 weeks ago, patients are typically placed in the lithotomy position and this extreme flexion of the lumbar spine can also worsen discogenic pain and radicular pain. X-rays, MRI of the lumbar spine, prednisone, formal PT. She will probably need some FMLA paperwork filled out for her PT appointments and visit with me. Return to see me in 4 weeks. If insufficient improvement we also may consider a pelvic MRI to evaluate for injury to the lumbosacral plexus during her vaginal hysterectomy.

## 2018-12-22 ENCOUNTER — Ambulatory Visit (INDEPENDENT_AMBULATORY_CARE_PROVIDER_SITE_OTHER): Payer: 59

## 2018-12-22 ENCOUNTER — Other Ambulatory Visit: Payer: Self-pay

## 2018-12-22 DIAGNOSIS — M5416 Radiculopathy, lumbar region: Secondary | ICD-10-CM | POA: Diagnosis not present

## 2018-12-23 ENCOUNTER — Ambulatory Visit: Payer: 59 | Admitting: Sports Medicine

## 2018-12-23 ENCOUNTER — Encounter: Payer: Self-pay | Admitting: Physician Assistant

## 2019-01-15 ENCOUNTER — Other Ambulatory Visit: Payer: Self-pay | Admitting: Obstetrics & Gynecology

## 2019-01-15 ENCOUNTER — Other Ambulatory Visit: Payer: Self-pay

## 2019-01-15 ENCOUNTER — Encounter: Payer: Self-pay | Admitting: Sports Medicine

## 2019-01-15 ENCOUNTER — Encounter: Payer: Self-pay | Admitting: Obstetrics & Gynecology

## 2019-01-15 ENCOUNTER — Ambulatory Visit (INDEPENDENT_AMBULATORY_CARE_PROVIDER_SITE_OTHER): Payer: 59 | Admitting: Obstetrics & Gynecology

## 2019-01-15 ENCOUNTER — Ambulatory Visit (INDEPENDENT_AMBULATORY_CARE_PROVIDER_SITE_OTHER): Payer: 59 | Admitting: Sports Medicine

## 2019-01-15 VITALS — BP 125/84 | HR 114 | Ht 65.0 in | Wt 167.0 lb

## 2019-01-15 DIAGNOSIS — M5416 Radiculopathy, lumbar region: Secondary | ICD-10-CM | POA: Diagnosis not present

## 2019-01-15 DIAGNOSIS — Z9889 Other specified postprocedural states: Secondary | ICD-10-CM

## 2019-01-15 DIAGNOSIS — R232 Flushing: Secondary | ICD-10-CM

## 2019-01-15 DIAGNOSIS — E559 Vitamin D deficiency, unspecified: Secondary | ICD-10-CM

## 2019-01-15 MED ORDER — VITAMIN D (ERGOCALCIFEROL) 1.25 MG (50000 UNIT) PO CAPS: 50000.0000 [IU] | ORAL_CAPSULE | ORAL | 0 refills | Status: DC

## 2019-01-15 MED ORDER — GABAPENTIN 300 MG PO CAPS: ORAL_CAPSULE | ORAL | 3 refills | Status: DC

## 2019-01-15 NOTE — Assessment & Plan Note (Signed)
Symptoms have evolved to be more of an L5 distribution, we have a good explanation for it from her lumbar spine MRI. 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 Adding aggressive formal physical therapy, gabapentin. Return in 6 weeks, we will likely proceed with left L4 and left L5 selective epidurals if no better.

## 2019-01-15 NOTE — Progress Notes (Signed)
   Subjective:    Patient ID: Marissa Chaney, female    DOB: [birth year], 43 y.o.   MRN: [id number]  HPI 43 yo married lady is here for a post op visit. She had a TVH/BS 5 weeks ago for symptomatic fibroids. She currently reports normal bowel and bladder function. She has not had sex since surgery. She tells me that she has been seeing Dr. Darene Lamer for left leg and LBP prior to surgery. She has had an MRI since then that showed 2 bulging lumbar discs. She plans to start PT very soon. She also reports that she has had hot flashes for about a year. She had blood work done through her job at [employer] and found out that her Vit D was 14. She had a deficiency in the past which Dr. Maisie Fus treated.  Review of Systems Pathology negative     Objective:   Physical Exam Breathing, conversing, and ambulating normally Well nourished, well hydrated Black female, no apparent distress Abd- benign Cuff- healed well Bimanual exam normal     Assessment & Plan:  Post op state- doing well Hot flashes for a year - check FSH (also has sarcoidosis) Rec out of work for another 2 weeks Start PT asap Treat with Vit D supplement Rec that she find another fam med doc

## 2019-01-15 NOTE — Progress Notes (Signed)
Subjective:    CC: Follow-up  HPI: Marissa Chaney is a pleasant 43 year old female, she returns, we are treating her for left lumbar radiculitis, she has not improved with prednisone, she now has clearance to participate in physical therapy, she is postop from vaginal hysterectomy.  Pain runs down the left leg, worse with standing, today she is tracing it out behind the left thigh, to the back of the left lower leg and the lateral ankle, not to the foot or toes.  I reviewed the past medical history, family history, social history, surgical history, and allergies today and no changes were needed.  Please see the problem list section below in epic for further details.  Past Medical History: Past Medical History:  Diagnosis Date  . Cervical polyp 05/31/2018  . Hypertension   . Iron deficiency anemia 04/29/2018  . Sarcoidosis    Past Surgical History: Past Surgical History:  Procedure Laterality Date  . NO PAST SURGERIES    . VAGINAL HYSTERECTOMY Bilateral 12/10/2018   Procedure: HYSTERECTOMY VAGINAL WITH SALPINGECTOMY;  Surgeon: Emily Filbert, MD;  Location: [facility];  Service: Gynecology;  Laterality: Bilateral;   Social History: Social History   Socioeconomic History  . Marital status: Married    Spouse name: Not on file  . Number of children: Not on file  . Years of education: Not on file  . Highest education level: Not on file  Occupational History  . Not on file  Social Needs  . Financial resource strain: Not hard at all  . Food insecurity    Worry: Never true    Inability: Never true  . Transportation needs    Medical: No    Non-medical: No  Tobacco Use  . Smoking status: Current Some Day Smoker    Types: Cigarettes  . Smokeless tobacco: Never Used  . Tobacco comment: 1-2 cig/day  Substance and Sexual Activity  . Alcohol use: Yes    Alcohol/week: 1.0 - 2.0 standard drinks    Types: 1 Glasses of wine per week  . Drug use: Never  . Sexual activity: Yes    Birth  control/protection: None  Lifestyle  . Physical activity    Days per week: 0 days    Minutes per session: 0 min  . Stress: Not at all  Relationships  . Social connections    Talks on phone: More than three times a week    Gets together: More than three times a week    Attends religious service: Never    Active member of club or organization: No    Attends meetings of clubs or organizations: Not on file    Relationship status: Married  Other Topics Concern  . Not on file  Social History Narrative  . Not on file   Family History: Family History  Problem Relation Age of Onset  . Hypertension Mother    Allergies: Allergies  Allergen Reactions  . Blueberry [Vaccinium Angustifolium] Nausea And Vomiting   Medications: See med rec.  Review of Systems: No fevers, chills, night sweats, weight loss, chest pain, or shortness of breath.   Objective:    General: Well Developed, well nourished, and in no acute distress.  Neuro: Alert and oriented x3, extra-ocular muscles intact, sensation grossly intact.  HEENT: Normocephalic, atraumatic, pupils equal round reactive to light, neck supple, no masses, no lymphadenopathy, thyroid nonpalpable.  Skin: Warm and dry, no rashes. Cardiac: Regular rate and rhythm, no murmurs rubs or gallops, no lower extremity edema.  Respiratory: Clear  to auscultation bilaterally. Not using accessory muscles, speaking in full sentences.  Impression and Recommendations:    Left lumbar radiculopathy Symptoms have evolved to be more of an L5 distribution, we have a good explanation for it from her lumbar spine MRI. 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 Adding aggressive formal physical therapy, gabapentin. Return in 6 weeks, we will likely proceed with left L4 and left L5 selective epidurals if no better.   ___________________________________________ Gwen Her. Dianah Field, M.D., ABFM., CAQSM. Primary Care and Sports Medicine [facility]  [facility]  Adjunct Professor of [place] [facility] [employer]

## 2019-01-16 ENCOUNTER — Ambulatory Visit (INDEPENDENT_AMBULATORY_CARE_PROVIDER_SITE_OTHER): Payer: 59 | Admitting: Physical Therapy

## 2019-01-16 ENCOUNTER — Encounter: Payer: Self-pay | Admitting: Physical Therapy

## 2019-01-16 DIAGNOSIS — M5416 Radiculopathy, lumbar region: Secondary | ICD-10-CM

## 2019-01-16 DIAGNOSIS — M6281 Muscle weakness (generalized): Secondary | ICD-10-CM | POA: Diagnosis not present

## 2019-01-16 DIAGNOSIS — R2681 Unsteadiness on feet: Secondary | ICD-10-CM

## 2019-01-16 LAB — FOLLICLE STIMULATING HORMONE: FSH: 17.8 m[IU]/mL

## 2019-01-16 NOTE — Therapy (Signed)
[place], [place], [address] Phone: [phone]   Fax:  [phone]  Physical Therapy Evaluation  Patient Details  Name: Marissa Chaney MRN: [id number] Date of Birth: 06/06/1975 Referring Provider (PT): Dr. Dianah Field   Encounter Date: 01/16/2019  PT End of Session - 01/16/19 0803    Visit Number  1    Number of Visits  12    Date for PT Re-Evaluation  02/27/19    PT Start Time  0803    PT Stop Time  0844    PT Time Calculation (min)  41 min    Activity Tolerance  Patient tolerated treatment well    Behavior During Therapy  [facility] for tasks assessed/performed       Past Medical History:  Diagnosis Date  . Cervical polyp 05/31/2018  . Hypertension   . Iron deficiency anemia 04/29/2018  . Sarcoidosis     Past Surgical History:  Procedure Laterality Date  . NO PAST SURGERIES    . VAGINAL HYSTERECTOMY Bilateral 12/10/2018   Procedure: HYSTERECTOMY VAGINAL WITH SALPINGECTOMY;  Surgeon: Emily Filbert, MD;  Location: [place] [facility];  Service: Gynecology;  Laterality: Bilateral;    There were no vitals filed for this visit.   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 She first noticed her left leg getting weak about a year and a half ago. Then in about July she noticed it getting worse and she started feeling pain in her back and leg. Pain is more when she is sitting and then sit to stand. After her hysterectomy pain increased severely.    Pertinent History  sarcoidosis, bulging discs L4/5 and L5/S1, hysterectomy 12/10/18    Diagnostic tests  MRI - disc bulge L4/5, L5/S1    Patient Stated Goals  to get rid of pain and increase strength    Currently in Pain?  Yes    Pain Score  4     Pain Location  Back    Pain Orientation  Left    Pain Descriptors / Indicators  Throbbing    Pain Type  Chronic pain    Pain Radiating Towards  to ankle    Pain Onset  More than a month ago    Pain Frequency  Constant    Aggravating Factors   bending, sitting    Pain Relieving Factors  unsure    Effect of Pain on Daily Activities  balance off when walking, pain with ADLS         [facility] PT Assessment - 01/16/19 0001      Assessment   Medical Diagnosis  left lumbar radiculopathy    Referring Provider (PT)  Dr. Dianah Field    Onset Date/Surgical Date  09/18/18    Next MD Visit  02/28/19    Prior Therapy  no      Precautions   Precaution Comments  recent hysterectomy but cleared by MD      Restrictions   Weight Bearing Restrictions  No      Balance Screen   Has the patient fallen in the past 6 months  No    Has the patient had a decrease in activity level because of a fear of falling?   No    Is the patient reluctant to leave their home because of a fear of falling?   No      Home Environment  Living Environment  Private residence    Living Arrangements  Spouse/significant other    Type of [place]  Two level    Alternate Level Stairs-Number of Steps  16      Prior Function   Level of Independence  Independent    Vocation  Full time employment    Sports coach    Leisure  walking      Posture/Postural Control   Posture/Postural Control  Postural limitations    Posture Comments  decreased lumbar lordosis      ROM / Strength   AROM / PROM / Strength  AROM;Strength      AROM   Overall AROM Comments  Full lumbar ROM; pull down left leg with flexion      Strength   Overall Strength Comments  Rt hip flex/ext 4+/5, ABD 5/5; knee ext 5/5, flex 4/5    Strength Assessment Site  Hip;Knee;Ankle    Right/Left Hip  Left    Left Hip Flexion  4-/5    Left Hip Extension  4-/5    Left Hip ABduction  4+/5    Right/Left Knee  Left    Left Knee Flexion  4-/5    Left Knee Extension  4+/5    Right/Left Ankle  Left    Left Ankle Dorsiflexion  4/5    Left Ankle Inversion  4-/5   cog wheel    Left Ankle Eversion  4/5   cog wheel     Flexibility   Soft Tissue Assessment /Muscle Length  yes    Hamstrings  mod tightness   left +SLR   Quadriceps  left tight    ITB  left + ober    Piriformis  left > Right    Quadratus Lumborum  tender      Palpation   Palpation comment  left gluteals, left QL/lumbar      Special Tests   Other special tests  prone press ups abolish pain in leg; +slump/SLR on right                Objective measurements completed on examination: See above findings.              PT Education - 01/16/19 0840    Education Details  HEP    Person(s) Educated  Patient    Methods  Explanation;Demonstration;Handout    Comprehension  Verbalized understanding;Returned demonstration          PT Long Term Goals - 01/16/19 1509      PT LONG TERM GOAL #1   Title  Ind with HEP to improve strength and flexibility    Time  6    Period  Weeks    Status  New    Target Date  02/27/19      PT LONG TERM GOAL #2   Title  Patient to report no pain in LLE with ADLS    Time  6    Period  Weeks    Status  New      PT LONG TERM GOAL #3   Title  Patient to demo 5/5 LLE strength to normalize ADLS    Time  6    Period  Weeks    Status  New      PT LONG TERM GOAL #4   Title  Patient able to perform ADLS including sitting with low back pain 2/10 or less.    Time  6  Period  Weeks    Status  New      PT LONG TERM GOAL #5   Title  Patient able to ambulate without gait deviations    Baseline  drifts laterally with gait (pt reported)    Time  6    Period  Weeks    Status  New             Plan - 01/16/19 1459    Clinical Impression Statement  Patient presents with c/o of left lumbar radiculopathy beginning about 1.5 years ago, but worsening since July. 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 She is currently on FMLA due to hysterectomy 12/10/18. She has pain in left leg to  ankle mainly with sitting and bending and MRI shows bulging discs at two levels. She also reports weakness in her LLE and states that her balance is "off" when walking. She is able to abolish her pain with prone press ups in clinic and this was issued for HEP. She will benefit from PT to address her pain and strength deficits to allow her to RTW and perform ADLS.    Personal Factors and Comorbidities  Comorbidity 3+    Comorbidities  sarcoidosis, bulging discs, recent hysterectomy    Examination-Activity Limitations  Bend;Lift;Sit    Stability/Clinical Decision Making  Stable/Uncomplicated    Clinical Decision Making  Low    Rehab Potential  Excellent    PT Frequency  2x / week    PT Duration  6 weeks    PT Treatment/Interventions  ADLs/Self Care Home Management;Electrical Stimulation;Cryotherapy;Moist Heat;Traction;Ultrasound;Therapeutic activities;Therapeutic exercise;Balance training;Neuromuscular re-education;Patient/family education;Dry needling;Manual techniques;Taping    PT Next Visit Plan  ADL modifications and body mechanics; progress extension protocol; add hip stretches to HEP; LE and core strengthening    PT Home Exercise Plan  [email or web address]    Consulted and Agree with Plan of Care  Patient       Patient will benefit from skilled therapeutic intervention in order to improve the following deficits and impairments:  Pain, Decreased activity tolerance, Impaired flexibility, Decreased balance, Decreased strength, Postural dysfunction  Visit Diagnosis: Radiculopathy, lumbar region - Plan: PT plan of care cert/re-cert  Muscle weakness (generalized) - Plan: PT plan of care cert/re-cert  Unsteadiness on feet - Plan: PT plan of care cert/re-cert     Problem List Patient Active Problem List   Diagnosis Date Noted  . Post-operative state 12/10/2018  . Cervical polyp 05/31/2018  . Uterine leiomyoma 05/31/2018  . Myofascial pain 05/31/2018  . Lichen simplex chronicus 05/01/2018  .  Chronic fatigue 05/01/2018  . Hypercalcemia 04/29/2018  . Iron deficiency anemia 04/29/2018  . Normocytic anemia 04/26/2018  . Vitamin D deficiency 04/26/2018  . Left lumbar radiculopathy 04/26/2018  . Intermenstrual bleeding 04/26/2018  . Hypertension goal BP (blood pressure) < 130/80 04/26/2018  . Tinea pedis of both feet 07/22/2015  . Sarcoidosis 09/09/2014  . Uveitis 06/03/2014    Madelyn Flavors PT 01/16/2019, 3:17 PM  [facility] [place], [place], [address] Phone: [phone]   Fax:  [phone]  Name: Marissa Chaney MRN: [id number] Date of Birth: 1975-04-06

## 2019-01-16 NOTE — Patient Instructions (Signed)
Access Code: [email or web address]  URL: [email or web address]  Date: 01/16/2019  Prepared by: Madelyn Flavors   Exercises Prone Press Up - 10 reps - 3 sets - 1x daily - 7x weekly Patient Education Posture and Body Mechanics

## 2019-01-21 ENCOUNTER — Encounter: Payer: Self-pay | Admitting: *Deleted

## 2019-01-22 ENCOUNTER — Encounter: Payer: Self-pay | Admitting: Sports Medicine

## 2019-01-22 ENCOUNTER — Ambulatory Visit (INDEPENDENT_AMBULATORY_CARE_PROVIDER_SITE_OTHER): Payer: 59 | Admitting: Physical Therapy

## 2019-01-22 ENCOUNTER — Other Ambulatory Visit: Payer: Self-pay

## 2019-01-22 ENCOUNTER — Ambulatory Visit (INDEPENDENT_AMBULATORY_CARE_PROVIDER_SITE_OTHER): Payer: 59 | Admitting: Sports Medicine

## 2019-01-22 DIAGNOSIS — M6281 Muscle weakness (generalized): Secondary | ICD-10-CM | POA: Diagnosis not present

## 2019-01-22 DIAGNOSIS — M5416 Radiculopathy, lumbar region: Secondary | ICD-10-CM

## 2019-01-22 DIAGNOSIS — R2681 Unsteadiness on feet: Secondary | ICD-10-CM

## 2019-01-22 NOTE — Therapy (Signed)
[place], [place], [address] Phone: [phone]   Fax:  [phone]  Physical Therapy Treatment  Patient Details  Name: Marissa Chaney MRN: [id number] Date of Birth: 1975-08-27 Referring Provider (PT): Dr. Dianah Field   Encounter Date: 01/22/2019  PT End of Session - 01/22/19 0848    Visit Number  2    Number of Visits  12    Date for PT Re-Evaluation  02/27/19    PT Start Time  0847    PT Stop Time  0935    PT Time Calculation (min)  48 min    Activity Tolerance  Patient tolerated treatment well    Behavior During Therapy  [facility] for tasks assessed/performed       Past Medical History:  Diagnosis Date  . Cervical polyp 05/31/2018  . Hypertension   . Iron deficiency anemia 04/29/2018  . Sarcoidosis     Past Surgical History:  Procedure Laterality Date  . NO PAST SURGERIES    . VAGINAL HYSTERECTOMY Bilateral 12/10/2018   Procedure: HYSTERECTOMY VAGINAL WITH SALPINGECTOMY;  Surgeon: Emily Filbert, MD;  Location: [place];  Service: Gynecology;  Laterality: Bilateral;    There were no vitals filed for this visit.  Subjective Assessment - 01/22/19 0849    Subjective  Patient awakes about 4 or 5 am in the morning and feels tender in left gluteals and when she gets up has numbness in LLE.    Pertinent History  sarcoidosis, bulging discs L4/5 and L5/S1, hysterectomy 12/10/18    Patient Stated Goals  to get rid of pain and increase strength    Currently in Pain?  No/denies                       [facility] Adult PT Treatment/Exercise - 01/22/19 0001      Self-Care   Self-Care  ADL's;Lifting    ADL's  Reviewed ADL and body mechanics booklet     Lifting  general lifting technique demonstrated and pt returned demo      Exercises   Exercises  Lumbar      Lumbar Exercises: Stretches   Sports administrator  Left;2 reps;60 seconds    ITB Stretch Limitations  attempted SDLY no stretch    Figure 4  Stretch  2 reps;30 seconds;Supine    Gastroc Stretch  Left;1 rep;60 seconds    Gastroc Stretch Limitations  feels nerve pain to ankle      Lumbar Exercises: Seated   Other Seated Lumbar Exercises  sciatic nerve glide 5 x 5 sec left      Lumbar Exercises: Supine   Other Supine Lumbar Exercises  sciatic nerve left in HS stretch position 5 x 5 sec             PT Education - 01/22/19 1314    Education Details  HEP progressed    Person(s) Educated  Patient    Methods  Explanation;Demonstration;Handout    Comprehension  Verbalized understanding;Returned demonstration          PT Long Term Goals - 01/16/19 1509      PT LONG TERM GOAL #1   Title  Ind with HEP to improve strength and flexibility    Time  6    Period  Weeks    Status  New    Target Date  02/27/19      PT LONG TERM GOAL #2   Title  Patient to report no  pain in LLE with ADLS    Time  6    Period  Weeks    Status  New      PT LONG TERM GOAL #3   Title  Patient to demo 5/5 LLE strength to normalize ADLS    Time  6    Period  Weeks    Status  New      PT LONG TERM GOAL #4   Title  Patient able to perform ADLS including sitting with low back pain 2/10 or less.    Time  6    Period  Weeks    Status  New      PT LONG TERM GOAL #5   Title  Patient able to ambulate without gait deviations    Baseline  drifts laterally with gait (pt reported)    Time  6    Period  Weeks    Status  New            Plan - 01/22/19 1312    Clinical Impression Statement  Patient reports relief with prone press ups and was encouraged to increase frequency to centralize sx. She feels significant tension with neural glides in sitting and supine. Body mechanics and ADLs were reviewed today and modifications suggested for work environment and home.    Comorbidities  sarcoidosis, bulging discs, recent hysterectomy    PT Treatment/Interventions  ADLs/Self Care Home Management;Electrical Stimulation;Cryotherapy;Moist  Heat;Traction;Ultrasound;Therapeutic activities;Therapeutic exercise;Balance training;Neuromuscular re-education;Patient/family education;Dry needling;Manual techniques;Taping    PT Next Visit Plan  progress extension protocol; add hip stretches to HEP; LE and core strengthening       Patient will benefit from skilled therapeutic intervention in order to improve the following deficits and impairments:  Pain, Decreased activity tolerance, Impaired flexibility, Decreased balance, Decreased strength, Postural dysfunction  Visit Diagnosis: Radiculopathy, lumbar region  Muscle weakness (generalized)  Unsteadiness on feet     Problem List Patient Active Problem List   Diagnosis Date Noted  . Post-operative state 12/10/2018  . Cervical polyp 05/31/2018  . Uterine leiomyoma 05/31/2018  . Myofascial pain 05/31/2018  . Lichen simplex chronicus 05/01/2018  . Chronic fatigue 05/01/2018  . Hypercalcemia 04/29/2018  . Iron deficiency anemia 04/29/2018  . Normocytic anemia 04/26/2018  . Vitamin D deficiency 04/26/2018  . Left lumbar radiculopathy 04/26/2018  . Intermenstrual bleeding 04/26/2018  . Hypertension goal BP (blood pressure) < 130/80 04/26/2018  . Tinea pedis of both feet 07/22/2015  . Sarcoidosis 09/09/2014  . Uveitis 06/03/2014    Madelyn Flavors PT 01/22/2019, 1:56 PM  [facility] [place], [place], [address] Phone: [phone]   Fax:  [phone]  Name: Marissa Chaney MRN: [id number] Date of Birth: 1975-12-19

## 2019-01-22 NOTE — Patient Instructions (Signed)
Access Code: [email or web address]  URL: [email or web address]  Date: 01/22/2019  Prepared by: Madelyn Flavors   Exercises Prone Press Up - 10 reps - 3 sets - 1x daily - 7x weekly Seated Piriformis Stretch - 3 reps - 1 sets - 30-60 sec hold - 2x daily - 7x weekly Seated Piriformis Stretch with Trunk Bend - 3 reps - 1 sets - 30-60 sec hold - 2x daily - 7x weekly Patient Education Biomedical scientist

## 2019-01-22 NOTE — Progress Notes (Signed)
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  I reviewed the past medical history, family history, social history, surgical history, and allergies today and no changes were needed.  Please see the problem list section below in epic for further details.  Past Medical History: Past Medical History:  Diagnosis Date  . Cervical polyp 05/31/2018  . Hypertension   . Iron deficiency anemia 04/29/2018  . Sarcoidosis    Past Surgical History: Past Surgical History:  Procedure Laterality Date  . NO PAST SURGERIES    . VAGINAL HYSTERECTOMY Bilateral 12/10/2018   Procedure: HYSTERECTOMY VAGINAL WITH SALPINGECTOMY;  Surgeon: Emily Filbert, MD;  Location: [facility];  Service: Gynecology;  Laterality: Bilateral;   Social History: Social History   Socioeconomic History  . Marital status: Married    Spouse name: Not on file  . Number of children: Not on file  . Years of education: Not on file  . Highest education level: Not on file  Occupational History  . Not on file  Social Needs  . Financial resource strain: Not hard at all  . Food insecurity    Worry: Never true    Inability: Never true  . Transportation needs    Medical: No    Non-medical: No  Tobacco Use  . Smoking status: Current Some Day Smoker    Types: Cigarettes  . Smokeless tobacco: Never Used  . Tobacco comment: 1-2 cig/day  Substance and Sexual Activity  . Alcohol use: Yes    Alcohol/week: 1.0 - 2.0 standard drinks    Types: 1 Glasses of wine per week  . Drug use: Never  . Sexual activity: Yes    Birth control/protection: None  Lifestyle  . Physical activity    Days per week: 0 days    Minutes per session: 0 min  . Stress: Not at all  Relationships  . Social connections    Talks on phone: More than three  times a week    Gets together: More than three times a week    Attends religious service: Never    Active member of club or organization: No    Attends meetings of clubs or organizations: Not on file    Relationship status: Married  Other Topics Concern  . Not on file  Social History Narrative  . Not on file   Family History: Family History  Problem Relation Age of Onset  . Hypertension Mother    Allergies: Allergies  Allergen Reactions  . Blueberry [Vaccinium Angustifolium] Nausea And Vomiting   Medications: See med rec.  Review of Systems: No fevers, chills, night sweats, weight loss, chest pain, or shortness of breath.   Objective:    General: Well Developed, well nourished, and in no acute distress.  Neuro: Alert and oriented x3, extra-ocular muscles intact, sensation grossly intact.  HEENT: Normocephalic, atraumatic, pupils equal round reactive to light, neck supple, no masses, no lymphadenopathy, thyroid nonpalpable.  Skin: Warm and dry, no rashes. Cardiac: Regular rate and rhythm, no murmurs rubs or gallops, no lower extremity edema.  Respiratory: Clear to auscultation bilaterally. Not using accessory muscles, speaking in full sentences.   Impression and Recommendations:    Left lumbar radiculopathy Symptoms have evolved into more of a left L5 distribution radiculitis, her lumbar spine MRI does explain it. She will  finish 6 full weeks of formal physical therapy before we consider a left L4 and L5 selective epidurals. I filled out her short-term disability paperwork today. She will continue gabapentin for symptomatic relief. I would like her to do a 10 pound lifting restriction at work and avoid prolonged stooping. She would also benefit from an ergonomic chair. Return to see me after formal PT.   ___________________________________________ Gwen Her. Dianah Field, M.D., ABFM., CAQSM. Primary Care and Sports Medicine [facility]  Adjunct  Professor of [place] [employer] [facility] [employer]

## 2019-01-22 NOTE — Assessment & Plan Note (Addendum)
Symptoms have evolved into more of a left L5 distribution radiculitis, her lumbar spine MRI does explain it. She will finish 6 full weeks of formal physical therapy before we consider a left L4 and L5 selective epidurals. I filled out her short-term disability paperwork today. She will continue gabapentin for symptomatic relief. I would like her to do a 10 pound lifting restriction at work and avoid prolonged stooping. She would also benefit from an ergonomic chair. Return to see me after formal PT.

## 2019-01-23 ENCOUNTER — Ambulatory Visit (INDEPENDENT_AMBULATORY_CARE_PROVIDER_SITE_OTHER): Payer: 59 | Admitting: Physical Therapy

## 2019-01-23 DIAGNOSIS — M5416 Radiculopathy, lumbar region: Secondary | ICD-10-CM

## 2019-01-23 DIAGNOSIS — M6281 Muscle weakness (generalized): Secondary | ICD-10-CM

## 2019-01-23 DIAGNOSIS — R2681 Unsteadiness on feet: Secondary | ICD-10-CM | POA: Diagnosis not present

## 2019-01-23 NOTE — Therapy (Signed)
[place], [place], [address] Phone: [phone]   Fax:  [phone]  Physical Therapy Treatment  Patient Details  Name: Marissa Chaney MRN: [id number] Date of Birth: [birth year] Referring Provider (PT): Dr. Dianah Field   Encounter Date: 01/23/2019  PT End of Session - 01/23/19 1019    Visit Number  3    Number of Visits  12    Date for PT Re-Evaluation  02/27/19    PT Start Time  1019    PT Stop Time  1102    PT Time Calculation (min)  43 min    Activity Tolerance  Patient tolerated treatment well    Behavior During Therapy  [facility] for tasks assessed/performed       Past Medical History:  Diagnosis Date  . Cervical polyp 05/31/2018  . Hypertension   . Iron deficiency anemia 04/29/2018  . Sarcoidosis     Past Surgical History:  Procedure Laterality Date  . NO PAST SURGERIES    . VAGINAL HYSTERECTOMY Bilateral 12/10/2018   Procedure: HYSTERECTOMY VAGINAL WITH SALPINGECTOMY;  Surgeon: Emily Filbert, MD;  Location: [place];  Service: Gynecology;  Laterality: Bilateral;    There were no vitals filed for this visit.  Subjective Assessment - 01/23/19 1019    Subjective  Had to use heat this morning but did extensions and press ups and does not have pain now.    Pertinent History  sarcoidosis, bulging discs L4/5 and L5/S1, hysterectomy 12/10/18    Diagnostic tests  MRI - disc bulge L4/5, L5/S1    Patient Stated Goals  to get rid of pain and increase strength    Currently in Pain?  No/denies                       [facility] Adult PT Treatment/Exercise - 01/23/19 0001      Exercises   Exercises  Knee/Hip      Lumbar Exercises: Standing   Other Standing Lumbar Exercises  ext x 10 after leg press and hip tband      Lumbar Exercises: Seated   Other Seated Lumbar Exercises  sciatic nerve glide 5 x 5 sec left with just knee ext and ankle DF      Knee/Hip Exercises: Machines for  Strengthening   Cybex Knee Extension  2 plates x 10 with both; 1 plate just left [id number]     Cybex Leg Press  2 plates left only x 10 cues for ab set and push through heel      Knee/Hip Exercises: Standing   Heel Raises  Both;1 set;10 reps    Heel Raises Limitations  then left only 1x10    Hip Abduction  Both;2 sets;10 reps;Knee straight    Abduction Limitations  Use yellow next visit on left; red req'd TCs for correct form    Hip Extension  Both;2 sets;10 reps;Knee straight    Extension Limitations  lowered to yellow after 1st set on left      Knee/Hip Exercises: Supine   Bridges  Both;20 reps    Bridges Limitations  5 sec hold    Other Supine Knee/Hip Exercises  prone press ups x 10      Manual Therapy   Manual Therapy  Joint mobilization;Soft tissue mobilization    Joint Mobilization  PA to left lumbar Gd III no pain and good mobility    Soft tissue mobilization  IASTM to left HS, lateral gastroc, peroneals  and ant tib             PT Education - 01/23/19 1213    Education Details  HeP progressed    Person(s) Educated  Patient    Methods  Explanation;Demonstration;Handout    Comprehension  Verbalized understanding;Returned demonstration          PT Long Term Goals - 01/16/19 1509      PT LONG TERM GOAL #1   Title  Ind with HEP to improve strength and flexibility    Time  6    Period  Weeks    Status  New    Target Date  02/27/19      PT LONG TERM GOAL #2   Title  Patient to report no pain in LLE with ADLS    Time  6    Period  Weeks    Status  New      PT LONG TERM GOAL #3   Title  Patient to demo 5/5 LLE strength to normalize ADLS    Time  6    Period  Weeks    Status  New      PT LONG TERM GOAL #4   Title  Patient able to perform ADLS including sitting with low back pain 2/10 or less.    Time  6    Period  Weeks    Status  New      PT LONG TERM GOAL #5   Title  Patient able to ambulate without gait deviations    Baseline  drifts laterally with  gait (pt reported)    Time  6    Period  Weeks    Status  New            Plan - 01/23/19 1214    Clinical Impression Statement  Patient did well with intiial TE today, but needed TCs and VCs to avoid compensations with standing exercises. Yellow band was issued for HEP due to compensations. Normal response to STM.    Comorbidities  sarcoidosis, bulging discs, recent hysterectomy    PT Treatment/Interventions  ADLs/Self Care Home Management;Electrical Stimulation;Cryotherapy;Moist Heat;Traction;Ultrasound;Therapeutic activities;Therapeutic exercise;Balance training;Neuromuscular re-education;Patient/family education;Dry needling;Manual techniques;Taping    PT Next Visit Plan  continue LE and core strengthening; add left ankle strengthening    PT Home Exercise Plan  [email or web address]       Patient will benefit from skilled therapeutic intervention in order to improve the following deficits and impairments:  Pain, Decreased activity tolerance, Impaired flexibility, Decreased balance, Decreased strength, Postural dysfunction  Visit Diagnosis: Radiculopathy, lumbar region  Muscle weakness (generalized)  Unsteadiness on feet     Problem List Patient Active Problem List   Diagnosis Date Noted  . Post-operative state 12/10/2018  . Cervical polyp 05/31/2018  . Uterine leiomyoma 05/31/2018  . Myofascial pain 05/31/2018  . Lichen simplex chronicus 05/01/2018  . Chronic fatigue 05/01/2018  . Hypercalcemia 04/29/2018  . Iron deficiency anemia 04/29/2018  . Normocytic anemia 04/26/2018  . Vitamin D deficiency 04/26/2018  . Left lumbar radiculopathy 04/26/2018  . Intermenstrual bleeding 04/26/2018  . Hypertension goal BP (blood pressure) < 130/80 04/26/2018  . Tinea pedis of both feet 07/22/2015  . Sarcoidosis 09/09/2014  . Uveitis 06/03/2014    Madelyn Flavors PT 01/23/2019, 12:23 PM  [facility] [place],  [place], [address] Phone: [phone]   Fax:  [phone]  Name: Marissa Chaney MRN: [id number] Date of Birth: 06-14-1975

## 2019-01-23 NOTE — Patient Instructions (Signed)
Access Code: [email or web address]  URL: [email or web address]  Date: 01/23/2019  Prepared by: Madelyn Flavors   Exercises Prone Press Up - 10 reps - 3 sets - 1x daily - 7x weekly Seated Piriformis Stretch - 3 reps - 1 sets - 30-60 sec hold - 2x daily - 7x weekly Seated Piriformis Stretch with Trunk Bend - 3 reps - 1 sets - 30-60 sec hold - 2x daily - 7x weekly Hip Extension with Resistance Loop - 10 reps - 3 sets - 1x daily - 7x weekly Standing Hip Abduction Kicks - 10 reps - 3 sets - 1x daily - 7x weekly Standing Heel Raises - 10 reps - 3 sets - 1x daily - 7x weekly Patient Education Biomedical scientist

## 2019-01-29 ENCOUNTER — Ambulatory Visit (INDEPENDENT_AMBULATORY_CARE_PROVIDER_SITE_OTHER): Payer: 59 | Admitting: Physical Therapy

## 2019-01-29 ENCOUNTER — Telehealth: Payer: Self-pay | Admitting: Physical Therapy

## 2019-01-29 ENCOUNTER — Other Ambulatory Visit: Payer: Self-pay

## 2019-01-29 DIAGNOSIS — M5416 Radiculopathy, lumbar region: Secondary | ICD-10-CM

## 2019-01-29 DIAGNOSIS — M6281 Muscle weakness (generalized): Secondary | ICD-10-CM

## 2019-01-29 NOTE — Therapy (Signed)
[place], [place], [address] Phone: [phone]   Fax:  [phone]  Physical Therapy Treatment  Patient Details  Name: Marissa Chaney MRN: [id number] Date of Birth: 10-Jan-1976 Referring Provider (PT): Dr. Dianah Field   Encounter Date: 01/29/2019  PT End of Session - 01/29/19 0847    Visit Number  4    Number of Visits  12    Date for PT Re-Evaluation  02/27/19    PT Start Time  0844    PT Stop Time  0934    PT Time Calculation (min)  50 min    Activity Tolerance  Patient tolerated treatment well    Behavior During Therapy  [facility] for tasks assessed/performed       Past Medical History:  Diagnosis Date  . Cervical polyp 05/31/2018  . Hypertension   . Iron deficiency anemia 04/29/2018  . Sarcoidosis     Past Surgical History:  Procedure Laterality Date  . NO PAST SURGERIES    . VAGINAL HYSTERECTOMY Bilateral 12/10/2018   Procedure: HYSTERECTOMY VAGINAL WITH SALPINGECTOMY;  Surgeon: Emily Filbert, MD;  Location: [place];  Service: Gynecology;  Laterality: Bilateral;    There were no vitals filed for this visit.  Subjective Assessment - 01/29/19 0848    Subjective  Pt reports she has had a flare up of pain in back since Sunday. Her yellow band broke, so she bought new bands from [employer]. Has been doing exercises to strengthen her LLE.  Pt returns to work 11/24.    Diagnostic tests  MRI - disc bulge L4/5, L5/S1    Patient Stated Goals  to get rid of pain and increase strength    Currently in Pain?  Yes    Pain Score  7     Pain Location  Back    Pain Orientation  Left    Pain Descriptors / Indicators  Tightness    Pain Radiating Towards  LLE to lateral ankle.    Aggravating Factors   bending    Pain Relieving Factors  holding knee to chest when on back         [facility] PT Assessment - 01/29/19 0001      Assessment   Medical Diagnosis  left lumbar radiculopathy    Referring Provider (PT)   Dr. Dianah Field    Onset Date/Surgical Date  09/18/18    Next MD Visit  02/28/19    Prior Therapy  no        [facility] Adult PT Treatment/Exercise - 01/29/19 0001      Self-Care   Self-Care  Posture;Other Self-Care Comments    Posture  Pt educated on spinal pressure with certain back positions, with pictures for reinforcement; pt verbalized understanding.     Other Self-Care Comments   Pt educated on importance of lumbar support and good posture for reduced symptoms.       Lumbar Exercises: Stretches   Passive Hamstring Stretch  Left;2 reps;30 seconds   supine with strap   Standing Extension  2 reps;5 seconds    Prone on Elbows Stretch  1 rep;30 seconds    Press Ups  10 reps;5 seconds      Lumbar Exercises: Aerobic   Nustep  L4: 6 min (legs only)      Lumbar Exercises: Seated   Sit to Stand  5 reps    Other Seated Lumbar Exercises  ab set with alternating arms to 90 deg  x 5 reps  Lumbar Exercises: Supine   Ab Set  10 reps;5 seconds    Clam  5 reps   each leg with ab set   Bent Knee Raise  10 reps   with ab set   Bridge  5 reps   with ab set   Other Supine Lumbar Exercises  reviewed log roll technique; pt returned demo of supine to/from sit with cues x 3 reps       Knee/Hip Exercises: Stretches   Passive Hamstring Stretch  --      Modalities   Modalities  Moist Heat;Electrical Stimulation      Moist Heat Therapy   Number Minutes Moist Heat  10 Minutes    Moist Heat Location  Lumbar Spine      Electrical Stimulation   Electrical Stimulation Location  bilat lower lumbar paraspinals     Electrical Stimulation Action  IFC    Electrical Stimulation Parameters  intensity to tolerance    Electrical Stimulation Goals  Pain                  PT Long Term Goals - 01/16/19 1509      PT LONG TERM GOAL #1   Title  Ind with HEP to improve strength and flexibility    Time  6    Period  Weeks    Status  New    Target Date  02/27/19      PT LONG TERM GOAL #2    Title  Patient to report no pain in LLE with ADLS    Time  6    Period  Weeks    Status  New      PT LONG TERM GOAL #3   Title  Patient to demo 5/5 LLE strength to normalize ADLS    Time  6    Period  Weeks    Status  New      PT LONG TERM GOAL #4   Title  Patient able to perform ADLS including sitting with low back pain 2/10 or less.    Time  6    Period  Weeks    Status  New      PT LONG TERM GOAL #5   Title  Patient able to ambulate without gait deviations    Baseline  drifts laterally with gait (pt reported)    Time  6    Period  Weeks    Status  New            Plan - 01/29/19 0951    Clinical Impression Statement  Pt arrived with elevated pain level in back with continued radicular symptoms.  Session focused on engaging core and importance of this with functional movement.  Pt reported reduction of pain by end of session.  Goals are ongoing at this time.    Comorbidities  sarcoidosis, bulging discs, recent hysterectomy    Rehab Potential  Excellent    PT Frequency  2x / week    PT Duration  6 weeks    PT Treatment/Interventions  ADLs/Self Care Home Management;Electrical Stimulation;Cryotherapy;Moist Heat;Traction;Ultrasound;Therapeutic activities;Therapeutic exercise;Balance training;Neuromuscular re-education;Patient/family education;Dry needling;Manual techniques;Taping    PT Next Visit Plan  continue LE and core strengthening; lumbar stabilization; possible TENS application education.    PT [place]       Patient will benefit from skilled therapeutic intervention in order to improve the following deficits and impairments:  Pain, Decreased activity tolerance, Impaired flexibility, Decreased balance, Decreased strength, Postural  dysfunction  Visit Diagnosis: Radiculopathy, lumbar region  Muscle weakness (generalized)     Problem List Patient Active Problem List   Diagnosis Date Noted  . Post-operative state 12/10/2018  . Cervical  polyp 05/31/2018  . Uterine leiomyoma 05/31/2018  . Myofascial pain 05/31/2018  . Lichen simplex chronicus 05/01/2018  . Chronic fatigue 05/01/2018  . Hypercalcemia 04/29/2018  . Iron deficiency anemia 04/29/2018  . Normocytic anemia 04/26/2018  . Vitamin D deficiency 04/26/2018  . Left lumbar radiculopathy 04/26/2018  . Intermenstrual bleeding 04/26/2018  . Hypertension goal BP (blood pressure) < 130/80 04/26/2018  . Tinea pedis of both feet 07/22/2015  . Sarcoidosis 09/09/2014  . Uveitis 06/03/2014   Kerin Perna, PTA 01/29/19 10:07 AM  [place], [place], [address] Phone: [phone]   Fax:  [phone]  Name: Marissa Chaney MRN: [id number] Date of Birth: 10/18/1975

## 2019-01-29 NOTE — Patient Instructions (Signed)
TENS UNIT: This is helpful for muscle pain and spasm.   Search and Purchase a TENS 7000 2nd edition at [employer]. It should be less than $30.     TENS unit instructions: Do not shower or bathe with the unit on Turn the unit off before removing electrodes or batteries If the electrodes lose stickiness add a drop of water to the electrodes after they are disconnected from the unit and place on plastic sheet. If you continued to have difficulty, call the TENS unit company to purchase more electrodes. Do not apply lotion on the skin area prior to use. Make sure the skin is clean and dry as this will help prolong the life of the electrodes. After use, always check skin for unusual red areas, rash or other skin difficulties. If there are any skin problems, does not apply electrodes to the same area. Never remove the electrodes from the unit by pulling the wires. Do not use the TENS unit or electrodes other than as directed. Do not change electrode placement without consultating your therapist or physician. Keep 2 fingers with between each electrode. Wear time ratio is 2:1, on to off times.    For example on for 30 minutes off for 15 minutes and then on for 30 minutes off for 15 minutes   Abdominal Bracing With Pelvic Floor (Hook-Lying)   With neutral spine, tighten pelvic floor and abdominals. Hold 10 seconds. Repeat __10_ times. Do _several__ times a day. Can be performed in sitting, standing. .  Knee to Chest: Transverse Plane Stability   Bring one knee up, then return. Be sure pelvis does not roll side to side. Keep pelvis still. Lift knee __10_ times each leg. Restabilize pelvis. Repeat with other leg. Do _1-2__ sets, _1-2__ times per day.  Hip External Rotation With Pillow: Transverse Plane Stability   KEEP BOTH KNEES BENT.  Slowly roll bent knee out. Be sure pelvis does not rotate. Do _10__ times. Restabilize pelvis. Repeat with other leg. Do _1-2__ sets, _1__ times per day.      [facility] [place], [place] [address]  [phone] (office) [phone] (fax)

## 2019-01-30 ENCOUNTER — Ambulatory Visit (INDEPENDENT_AMBULATORY_CARE_PROVIDER_SITE_OTHER): Payer: 59 | Admitting: Physical Therapy

## 2019-01-30 ENCOUNTER — Encounter: Payer: Self-pay | Admitting: Physical Therapy

## 2019-01-30 DIAGNOSIS — M5416 Radiculopathy, lumbar region: Secondary | ICD-10-CM

## 2019-01-30 DIAGNOSIS — M6281 Muscle weakness (generalized): Secondary | ICD-10-CM | POA: Diagnosis not present

## 2019-01-30 NOTE — Patient Instructions (Signed)
Pelvic Press  [id number]   Place hands under belly between navel and pubic bone, palms up. Feel pressure on hands. Increase pressure on hands by pressing pelvis down. This is NOT a pelvic tilt. Hold __5_ seconds. Relax. Repeat _10__ times. Once a day.  KNEE: Flexion - Prone   Hold pelvic press. Bend knee. Raise heel toward buttocks. Repeat on opposite leg. Do not raise hips. _10__ reps per set. When this is mastered, pull both heels up at same time, x 10 reps.  Once a day   Hip Extension (Prone)  Hold pelvic press. You may use one pillow under your waist if you like. Lift left leg _3___ inches from floor, keeping knee locked. Repeat __10__ times per set. Do _1___ sets per session. Do _10___ sessions per day.  Madelyn Flavors, PT 01/30/19 9:19 AM  [facility] at [place], [place] [address]  [phone] (office) [phone] (fax)

## 2019-01-30 NOTE — Therapy (Signed)
[place], [place], [address] Phone: [phone]   Fax:  [phone]  Physical Therapy Treatment  Patient Details  Name: Marissa Chaney MRN: [id number] Date of Birth: 05-Feb-1976 Referring Provider (PT): Dr. Dianah Field   Encounter Date: 01/30/2019  PT End of Session - 01/30/19 0851    Visit Number  5    Number of Visits  12    Date for PT Re-Evaluation  02/27/19    PT Start Time  0848    PT Stop Time  0938    PT Time Calculation (min)  50 min       Past Medical History:  Diagnosis Date  . Cervical polyp 05/31/2018  . Hypertension   . Iron deficiency anemia 04/29/2018  . Sarcoidosis     Past Surgical History:  Procedure Laterality Date  . NO PAST SURGERIES    . VAGINAL HYSTERECTOMY Bilateral 12/10/2018   Procedure: HYSTERECTOMY VAGINAL WITH SALPINGECTOMY;  Surgeon: Emily Filbert, MD;  Location: [place];  Service: Gynecology;  Laterality: Bilateral;    There were no vitals filed for this visit.  Subjective Assessment - 01/30/19 0852    Subjective  Patient still having pain in low back and into LLE.    Pertinent History  sarcoidosis, bulging discs L4/5 and L5/S1, hysterectomy 12/10/18    Diagnostic tests  MRI - disc bulge L4/5, L5/S1    Patient Stated Goals  to get rid of pain and increase strength    Currently in Pain?  Yes    Pain Score  4     Pain Location  Back    Pain Orientation  Left    Pain Descriptors / Indicators  Tightness                       [facility] Adult PT Treatment/Exercise - 01/30/19 0001      Lumbar Exercises: Standing   Other Standing Lumbar Exercises  ext 2x10      Lumbar Exercises: Supine   Ab Set  10 reps;5 seconds    Clam  5 reps   each leg with ab set   Bent Knee Raise  10 reps   with ab set   Bridge  10 reps;5 seconds    Bridge with clamshell  10 reps      Lumbar Exercises: Prone   Straight Leg Raise  10 reps   bil; LLE spasmed afterward    Other Prone Lumbar Exercises  pelvic press series 5x 5sec, unilat knee bends 2x5, bil x 5,      Modalities   Modalities  Moist Heat;Electrical Stimulation      Moist Heat Therapy   Number Minutes Moist Heat  10 Minutes    Moist Heat Location  Lumbar Spine      Electrical Stimulation   Electrical Stimulation Location  bilat lower lumbar paraspinals     Electrical Stimulation Action  IFC    Electrical Stimulation Parameters  x 15 min to tolerance    Electrical Stimulation Goals  Pain      Manual Therapy   Manual Therapy  Manual Traction;Myofascial release    Myofascial Release  to left hip flexor in hooklying    Manual Traction  with belt in hooklying 2 bouts x 20 sec             PT Education - 01/30/19 0927    Education Details  HEP;    Person(s) Educated  Patient    Methods  Explanation;Demonstration;Handout    Comprehension  Verbalized understanding;Returned demonstration          PT Long Term Goals - 01/30/19 1322      PT LONG TERM GOAL #1   Title  Ind with HEP to improve strength and flexibility    Status  On-going            Plan - 01/30/19 0935    Clinical Impression Statement  Pt presented with decreased pain today but still 4/10 pain. She gets some relief with standing ext, but more with press ups. She was able to abolish pain with pelvic press series. Pt has very weak core and has difficulty maintaining ab set with basic stab exercises.    Comorbidities  sarcoidosis, bulging discs, recent hysterectomy    PT Treatment/Interventions  ADLs/Self Care Home Management;Electrical Stimulation;Cryotherapy;Moist Heat;Traction;Ultrasound;Therapeutic activities;Therapeutic exercise;Balance training;Neuromuscular re-education;Patient/family education;Dry needling;Manual techniques;Taping    PT Next Visit Plan  continue LE and core strengthening; lumbar stabilization; possible TENS application education.    PT Home Exercise Plan  [email or web address]; pelvic press series        Patient will benefit from skilled therapeutic intervention in order to improve the following deficits and impairments:  Pain, Decreased activity tolerance, Impaired flexibility, Decreased balance, Decreased strength, Postural dysfunction  Visit Diagnosis: Radiculopathy, lumbar region  Muscle weakness (generalized)     Problem List Patient Active Problem List   Diagnosis Date Noted  . Post-operative state 12/10/2018  . Cervical polyp 05/31/2018  . Uterine leiomyoma 05/31/2018  . Myofascial pain 05/31/2018  . Lichen simplex chronicus 05/01/2018  . Chronic fatigue 05/01/2018  . Hypercalcemia 04/29/2018  . Iron deficiency anemia 04/29/2018  . Normocytic anemia 04/26/2018  . Vitamin D deficiency 04/26/2018  . Left lumbar radiculopathy 04/26/2018  . Intermenstrual bleeding 04/26/2018  . Hypertension goal BP (blood pressure) < 130/80 04/26/2018  . Tinea pedis of both feet 07/22/2015  . Sarcoidosis 09/09/2014  . Uveitis 06/03/2014    Madelyn Flavors PT 01/30/2019, 1:32 PM  [facility] [place], [place], [address] Phone: [phone]   Fax:  [phone]  Name: Marissa Chaney MRN: [id number] Date of Birth: [birth year]

## 2019-02-04 ENCOUNTER — Other Ambulatory Visit: Payer: Self-pay

## 2019-02-04 ENCOUNTER — Ambulatory Visit (INDEPENDENT_AMBULATORY_CARE_PROVIDER_SITE_OTHER): Payer: 59 | Admitting: Physical Therapy

## 2019-02-04 DIAGNOSIS — M6281 Muscle weakness (generalized): Secondary | ICD-10-CM

## 2019-02-04 DIAGNOSIS — M5416 Radiculopathy, lumbar region: Secondary | ICD-10-CM | POA: Diagnosis not present

## 2019-02-04 NOTE — Therapy (Signed)
[place], [place], [address] Phone: [phone]   Fax:  [phone]  Physical Therapy Treatment  Patient Details  Name: Marissa Chaney MRN: [id number] Date of Birth: 08/31/1975 Referring Provider (PT): Dr. Dianah Field   Encounter Date: 02/04/2019  PT End of Session - 02/04/19 0845    Visit Number  6    Number of Visits  12    Date for PT Re-Evaluation  02/27/19    PT Start Time  0842    PT Stop Time  0932    PT Time Calculation (min)  50 min       Past Medical History:  Diagnosis Date  . Cervical polyp 05/31/2018  . Hypertension   . Iron deficiency anemia 04/29/2018  . Sarcoidosis     Past Surgical History:  Procedure Laterality Date  . NO PAST SURGERIES    . VAGINAL HYSTERECTOMY Bilateral 12/10/2018   Procedure: HYSTERECTOMY VAGINAL WITH SALPINGECTOMY;  Surgeon: Emily Filbert, MD;  Location: [facility];  Service: Gynecology;  Laterality: Bilateral;    There were no vitals filed for this visit.  Subjective Assessment - 02/04/19 0846    Subjective  Pt reports her back is no longer painful, but she reports pain in her Lt buttock.  She is trying to prepare herself to return to work.    Diagnostic tests  MRI - disc bulge L4/5, L5/S1    Patient Stated Goals  to get rid of pain and increase strength    Currently in Pain?  Yes    Pain Score  5     Pain Location  Buttocks    Pain Orientation  Left    Pain Descriptors / Indicators  Tightness    Aggravating Factors   bending forward    Pain Relieving Factors  prone or standing ext.         [facility] PT Assessment - 02/04/19 0001      Assessment   Medical Diagnosis  left lumbar radiculopathy    Referring Provider (PT)  Dr. Dianah Field    Onset Date/Surgical Date  09/18/18    Next MD Visit  02/28/19    Prior Therapy  no                 [facility] Adult PT Treatment/Exercise - 02/04/19 0001      Self-Care   Self-Care  Other Self-Care  Comments    Other Self-Care Comments   Pt educated on self massage to glutes with ball to decrease fascial tension; pt returned demo with cues      Lumbar Exercises: Stretches   Passive Hamstring Stretch  Left;2 reps;30 seconds;Right;1 rep   supine with strap   Press Ups  5 reps;5 seconds    Piriformis Stretch  Left;2 reps;Right;1 rep;30 seconds   supine     Lumbar Exercises: Aerobic   Nustep  L4: 5 min (60 SPM) legs only      Lumbar Exercises: Prone   Straight Leg Raise  5 reps   core engaged   Other Prone Lumbar Exercises  pelvic press series 5x 5sec, unilat knee bends 2x5, bil x 5,      Modalities   Modalities  Traction      Traction   Type of Traction  Lumbar   supine; pt wt 171#   Min (lbs)  30    Max (lbs)  50    Hold Time  60    Rest Time  20  Time  10   total time of 15 min                 PT Long Term Goals - 02/04/19 0858      PT LONG TERM GOAL #1   Title  Ind with HEP to improve strength and flexibility    Time  6    Period  Weeks    Status  On-going      PT LONG TERM GOAL #2   Title  Patient to report no pain in LLE with ADLS    Baseline  pt reports pain up to 7/10 with bathing and shopping (02/04/19)    Time  6    Period  Weeks    Status  On-going      PT LONG TERM GOAL #3   Title  Patient to demo 5/5 LLE strength to normalize ADLS    Time  6    Period  Weeks    Status  On-going      PT LONG TERM GOAL #4   Title  Patient able to perform ADLS including sitting with low back pain 2/10 or less.    Baseline  able to sit, but reports needing to reposition frequently (02/04/19)    Time  6    Period  Weeks    Status  On-going      PT LONG TERM GOAL #5   Title  Patient able to ambulate without gait deviations    Baseline  drifts laterally with gait (pt reported)    Time  6    Period  Weeks    Status  On-going            Plan - 02/04/19 0946    Clinical Impression Statement  Overall reduction in low back pain, however  persistant radicular symptoms in LLE. Pt reported reduction of radicular symptoms with piriformis stretch and self massage. Trial of traction initiated with 30% body wt; pt reported slight increase in radicular symptoms in ant LLE at completion of traction.Pt making gradual progress towards established goals.    Comorbidities  sarcoidosis, bulging discs, recent hysterectomy    Rehab Potential  Excellent    PT Frequency  2x / week    PT Duration  6 weeks    PT Treatment/Interventions  ADLs/Self Care Home Management;Electrical Stimulation;Cryotherapy;Moist Heat;Traction;Ultrasound;Therapeutic activities;Therapeutic exercise;Balance training;Neuromuscular re-education;Patient/family education;Dry needling;Manual techniques;Taping    PT Next Visit Plan  assess response to traction; MMT LLE; continue core and lumbar strengthening.    PT Home Exercise Plan  [id number]; pelvic press series    Consulted and Agree with Plan of Care  Patient       Patient will benefit from skilled therapeutic intervention in order to improve the following deficits and impairments:  Pain, Decreased activity tolerance, Impaired flexibility, Decreased balance, Decreased strength, Postural dysfunction  Visit Diagnosis: Radiculopathy, lumbar region  Muscle weakness (generalized)     Problem List Patient Active Problem List   Diagnosis Date Noted  . Post-operative state 12/10/2018  . Cervical polyp 05/31/2018  . Uterine leiomyoma 05/31/2018  . Myofascial pain 05/31/2018  . Lichen simplex chronicus 05/01/2018  . Chronic fatigue 05/01/2018  . Hypercalcemia 04/29/2018  . Iron deficiency anemia 04/29/2018  . Normocytic anemia 04/26/2018  . Vitamin D deficiency 04/26/2018  . Left lumbar radiculopathy 04/26/2018  . Intermenstrual bleeding 04/26/2018  . Hypertension goal BP (blood pressure) < 130/80 04/26/2018  . Tinea pedis of both feet 07/22/2015  . Sarcoidosis 09/09/2014  .  Uveitis 06/03/2014   Kerin Perna, PTA 02/04/19 10:30 AM  [facility] [place], [place], [address] Phone: [phone]   Fax:  [phone]  Name: Marissa Chaney MRN: [id number] Date of Birth: [birth year]

## 2019-02-05 ENCOUNTER — Ambulatory Visit (INDEPENDENT_AMBULATORY_CARE_PROVIDER_SITE_OTHER): Payer: 59 | Admitting: Physical Therapy

## 2019-02-05 ENCOUNTER — Encounter: Payer: Self-pay | Admitting: Physical Therapy

## 2019-02-05 DIAGNOSIS — M5416 Radiculopathy, lumbar region: Secondary | ICD-10-CM

## 2019-02-05 DIAGNOSIS — M6281 Muscle weakness (generalized): Secondary | ICD-10-CM

## 2019-02-05 NOTE — Therapy (Signed)
[place], [place], [address] Phone: [phone]   Fax:  [phone]  Physical Therapy Treatment  Patient Details  Name: Marissa Chaney MRN: [id number] Date of Birth: 1976-03-08 Referring Provider (PT): Dr. Dianah Field   Encounter Date: 02/05/2019  PT End of Session - 02/05/19 0933    Visit Number  7    Number of Visits  12    Date for PT Re-Evaluation  02/27/19    PT Start Time  0933    PT Stop Time  1014    PT Time Calculation (min)  41 min    Activity Tolerance  Patient tolerated treatment well    Behavior During Therapy  [facility] for tasks assessed/performed       Past Medical History:  Diagnosis Date  . Cervical polyp 05/31/2018  . Hypertension   . Iron deficiency anemia 04/29/2018  . Sarcoidosis     Past Surgical History:  Procedure Laterality Date  . NO PAST SURGERIES    . VAGINAL HYSTERECTOMY Bilateral 12/10/2018   Procedure: HYSTERECTOMY VAGINAL WITH SALPINGECTOMY;  Surgeon: Emily Filbert, MD;  Location: [facility];  Service: Gynecology;  Laterality: Bilateral;    There were no vitals filed for this visit.  Subjective Assessment - 02/05/19 0939    Subjective  Patient reports being sore after traction, but denies pain today.    Pertinent History  sarcoidosis, bulging discs L4/5 and L5/S1, hysterectomy 12/10/18    Diagnostic tests  MRI - disc bulge L4/5, L5/S1    Patient Stated Goals  to get rid of pain and increase strength                       [facility] Adult PT Treatment/Exercise - 02/05/19 0001      Self-Care   Self-Care  Other Self-Care Comments    Other Self-Care Comments   discussed MRI results and gave explanation with spine; also discussed positioning for relief      Lumbar Exercises: Prone   Straight Leg Raise  10 reps   bil   Opposite Arm/Leg Raise  10 reps;Right arm/Left leg;Left arm/Right leg    Other Prone Lumbar Exercises  pelvic press 2x 5 sec      Lumbar Exercises: Quadruped   Other Quadruped Lumbar Exercises  hip ext with toe tap x 10 ea alternating       Manual Therapy   Manual Therapy  Soft tissue mobilization    Manual therapy comments  skilled palpation and monitoring of soft tissue during DN      Soft tissue mobilization  to gluteals and piriformis left       Trigger Point Dry Needling - 02/05/19 0001    Consent Given?  Yes    Education Handout Provided  Yes    Muscles Treated Back/Hip  Gluteus minimus;Gluteus medius;Piriformis    Dry Needling Comments  left    Gluteus Minimus Response  Twitch response elicited;Palpable increased muscle length    Gluteus Medius Response  Palpable increased muscle length    Piriformis Response  Twitch response elicited;Palpable increased muscle length           PT Education - 02/05/19 1043    Education Details  HEP progressed, DN ed    Person(s) Educated  Patient    Methods  Explanation;Demonstration;Handout    Comprehension  Verbalized understanding;Returned demonstration          PT Long Term Goals - 02/04/19 ID:[id number]  PT LONG TERM GOAL #1   Title  Ind with HEP to improve strength and flexibility    Time  6    Period  Weeks    Status  On-going      PT LONG TERM GOAL #2   Title  Patient to report no pain in LLE with ADLS    Baseline  pt reports pain up to 7/10 with bathing and shopping (02/04/19)    Time  6    Period  Weeks    Status  On-going      PT LONG TERM GOAL #3   Title  Patient to demo 5/5 LLE strength to normalize ADLS    Time  6    Period  Weeks    Status  On-going      PT LONG TERM GOAL #4   Title  Patient able to perform ADLS including sitting with low back pain 2/10 or less.    Baseline  able to sit, but reports needing to reposition frequently (02/04/19)    Time  6    Period  Weeks    Status  On-going      PT LONG TERM GOAL #5   Title  Patient able to ambulate without gait deviations    Baseline  drifts laterally with gait (pt reported)     Time  6    Period  Weeks    Status  On-going            Plan - 02/05/19 1018    Clinical Impression Statement  Patient reported increased soreness in left hip after traction, but denied pain today. She did well with advancement of core exercises. She had tightness in left gluteals and piriformis and responded well to DN. She RTW next week so mechanics should be reviewed again.    Comorbidities  sarcoidosis, bulging discs, recent hysterectomy    PT Treatment/Interventions  ADLs/Self Care Home Management;Electrical Stimulation;Cryotherapy;Moist Heat;Traction;Ultrasound;Therapeutic activities;Therapeutic exercise;Balance training;Neuromuscular re-education;Patient/family education;Dry needling;Manual techniques;Taping    PT Next Visit Plan  assess goals and response to DN; MMT LLE; continue core and lumbar strengthening. Review body mechanics again for RTW.    PT Home Exercise Plan  [email or web address]; pelvic press series       Patient will benefit from skilled therapeutic intervention in order to improve the following deficits and impairments:  Pain, Decreased activity tolerance, Impaired flexibility, Decreased balance, Decreased strength, Postural dysfunction  Visit Diagnosis: Radiculopathy, lumbar region  Muscle weakness (generalized)     Problem List Patient Active Problem List   Diagnosis Date Noted  . Post-operative state 12/10/2018  . Cervical polyp 05/31/2018  . Uterine leiomyoma 05/31/2018  . Myofascial pain 05/31/2018  . Lichen simplex chronicus 05/01/2018  . Chronic fatigue 05/01/2018  . Hypercalcemia 04/29/2018  . Iron deficiency anemia 04/29/2018  . Normocytic anemia 04/26/2018  . Vitamin D deficiency 04/26/2018  . Left lumbar radiculopathy 04/26/2018  . Intermenstrual bleeding 04/26/2018  . Hypertension goal BP (blood pressure) < 130/80 04/26/2018  . Tinea pedis of both feet 07/22/2015  . Sarcoidosis 09/09/2014  . Uveitis 06/03/2014    Madelyn Flavors PT 02/05/2019,  11:20 AM  [facility] [place], [place], [address] Phone: [phone]   Fax:  [phone]  Name: Marissa Chaney MRN: [id number] Date of Birth: [birth year]

## 2019-02-07 ENCOUNTER — Encounter: Payer: Self-pay | Admitting: Sports Medicine

## 2019-02-07 ENCOUNTER — Other Ambulatory Visit: Payer: Self-pay

## 2019-02-07 ENCOUNTER — Ambulatory Visit (INDEPENDENT_AMBULATORY_CARE_PROVIDER_SITE_OTHER): Payer: 59 | Admitting: Sports Medicine

## 2019-02-07 DIAGNOSIS — M5416 Radiculopathy, lumbar region: Secondary | ICD-10-CM | POA: Diagnosis not present

## 2019-02-07 NOTE — Progress Notes (Addendum)
Subjective:    CC: Follow-up  HPI: Annalene is here for her FMLA paperwork  I reviewed the past medical history, family history, social history, surgical history, and allergies today and no changes were needed.  Please see the problem list section below in epic for further details.  Past Medical History: Past Medical History:  Diagnosis Date  . Cervical polyp 05/31/2018  . Hypertension   . Iron deficiency anemia 04/29/2018  . Sarcoidosis    Past Surgical History: Past Surgical History:  Procedure Laterality Date  . NO PAST SURGERIES    . VAGINAL HYSTERECTOMY Bilateral 12/10/2018   Procedure: HYSTERECTOMY VAGINAL WITH SALPINGECTOMY;  Surgeon: Emily Filbert, MD;  Location: [facility];  Service: Gynecology;  Laterality: Bilateral;   Social History: Social History   Socioeconomic History  . Marital status: Married    Spouse name: Not on file  . Number of children: Not on file  . Years of education: Not on file  . Highest education level: Not on file  Occupational History  . Not on file  Social Needs  . Financial resource strain: Not hard at all  . Food insecurity    Worry: Never true    Inability: Never true  . Transportation needs    Medical: No    Non-medical: No  Tobacco Use  . Smoking status: Current Some Day Smoker    Types: Cigarettes  . Smokeless tobacco: Never Used  . Tobacco comment: 1-2 cig/day  Substance and Sexual Activity  . Alcohol use: Yes    Alcohol/week: 1.0 - 2.0 standard drinks    Types: 1 Glasses of wine per week  . Drug use: Never  . Sexual activity: Yes    Birth control/protection: None  Lifestyle  . Physical activity    Days per week: 0 days    Minutes per session: 0 min  . Stress: Not at all  Relationships  . Social connections    Talks on phone: More than three times a week    Gets together: More than three times a week    Attends religious service: Never    Active member of club or organization: No    Attends meetings of clubs or  organizations: Not on file    Relationship status: Married  Other Topics Concern  . Not on file  Social History Narrative  . Not on file   Family History: Family History  Problem Relation Age of Onset  . Hypertension Mother    Allergies: Allergies  Allergen Reactions  . Blueberry [Vaccinium Angustifolium] Nausea And Vomiting   Medications: See med rec.  Review of Systems: No fevers, chills, night sweats, weight loss, chest pain, or shortness of breath.   Objective:    General: Well Developed, well nourished, and in no acute distress.  Neuro: Alert and oriented x3, extra-ocular muscles intact, sensation grossly intact.  HEENT: Normocephalic, atraumatic, pupils equal round reactive to light, neck supple, no masses, no lymphadenopathy, thyroid nonpalpable.  Skin: Warm and dry, no rashes. Cardiac: Regular rate and rhythm, no murmurs rubs or gallops, no lower extremity edema.  Respiratory: Clear to auscultation bilaterally. Not using accessory muscles, speaking in full sentences.   Impression and Recommendations:    Left lumbar radiculopathy Symptoms have evolved into more of a left L5 distribution radiculitis, well explained with her lumbar spine MRI. She will finish 6 full weeks of formal PT before considering a left L4 and left L5 selective epidural. Disability paperwork filled out at the last visit, FMLA  paperwork filled out today, continue gabapentin for symptomatic relief. She would benefit from an ergonomic chair, see me after PT.  I spent 15 minutes with this patient, greater than 50% was face-to-face time counseling regarding the above diagnoses.  ___________________________________________ Gwen Her. Dianah Field, M.D., ABFM., CAQSM. Primary Care and Sports Medicine  [facility]  Adjunct Professor of [employer]

## 2019-02-07 NOTE — Assessment & Plan Note (Signed)
Symptoms have evolved into more of a left L5 distribution radiculitis, well explained with her lumbar spine MRI. She will finish 6 full weeks of formal PT before considering a left L4 and left L5 selective epidural. Disability paperwork filled out at the last visit, FMLA paperwork filled out today, continue gabapentin for symptomatic relief. She would benefit from an ergonomic chair, see me after PT.

## 2019-02-07 NOTE — Addendum Note (Signed)
Addended by: Silverio Decamp on: 02/07/2019 11:38 AM   Modules accepted: Level of Service

## 2019-02-10 ENCOUNTER — Ambulatory Visit (INDEPENDENT_AMBULATORY_CARE_PROVIDER_SITE_OTHER): Payer: 59 | Admitting: Physical Therapy

## 2019-02-10 ENCOUNTER — Encounter: Payer: Self-pay | Admitting: Physical Therapy

## 2019-02-10 ENCOUNTER — Other Ambulatory Visit: Payer: Self-pay

## 2019-02-10 DIAGNOSIS — M5416 Radiculopathy, lumbar region: Secondary | ICD-10-CM

## 2019-02-10 DIAGNOSIS — R2681 Unsteadiness on feet: Secondary | ICD-10-CM | POA: Diagnosis not present

## 2019-02-10 DIAGNOSIS — M6281 Muscle weakness (generalized): Secondary | ICD-10-CM

## 2019-02-10 NOTE — Therapy (Signed)
[place], [place], [address] Phone: [phone]   Fax:  [phone]  Physical Therapy Treatment  Patient Details  Name: Marissa Chaney MRN: [id number] Date of Birth: 03-12-1976 Referring Provider (PT): Dr. Dianah Field   Encounter Date: 02/10/2019  PT End of Session - 02/10/19 1356    Visit Number  8    Number of Visits  12    Date for PT Re-Evaluation  02/27/19    PT Start Time  1347    PT Stop Time  1430    PT Time Calculation (min)  43 min    Activity Tolerance  Patient tolerated treatment well    Behavior During Therapy  [facility] for tasks assessed/performed       Past Medical History:  Diagnosis Date  . Cervical polyp 05/31/2018  . Hypertension   . Iron deficiency anemia 04/29/2018  . Sarcoidosis     Past Surgical History:  Procedure Laterality Date  . NO PAST SURGERIES    . VAGINAL HYSTERECTOMY Bilateral 12/10/2018   Procedure: HYSTERECTOMY VAGINAL WITH SALPINGECTOMY;  Surgeon: Emily Filbert, MD;  Location: [facility];  Service: Gynecology;  Laterality: Bilateral;    There were no vitals filed for this visit.  Subjective Assessment - 02/10/19 1357    Subjective  Pt reports good response to DN in her Lt hip.  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  No/denies    Pain Score  0-No pain         [facility] PT Assessment - 02/10/19 0001      Assessment   Medical Diagnosis  left lumbar radiculopathy    Referring Provider (PT)  Dr. Dianah Field    Onset Date/Surgical Date  09/18/18    Next MD Visit  02/28/19    Prior Therapy  no      Strength   Right/Left Hip  Left    Left Hip Flexion  4/5    Left Hip Extension  4+/5    Left Hip ABduction   --   5-/5   Right/Left Knee  Left    Left Knee Flexion  4+/5    Left Ankle Dorsiflexion  5/5    Left Ankle Inversion  4-/5   cog wheel   Left Ankle Eversion  4/5   cog wheel      [facility] Adult PT Treatment/Exercise - 02/10/19 0001      Self-Care   ADL's  reviewed body mechanics with blood draws including moving feet to turn instead of twisting back and bending knees instead of only hip hinging forward; pt returned demo x 4 reps each     Posture  reviewed posture at work station with pt; pt verbalized understanding.     Other Self-Care Comments   reviewed log roll with pt return demo with cues 2x.       Lumbar Exercises: Stretches   Passive Hamstring Stretch  Left;2 reps;20 seconds   supine with strap   Prone on Elbows Stretch  2 reps;20 seconds    Press Ups  5 reps;5 seconds    Piriformis Stretch  Right;Left;1 rep;3 reps;20 seconds      Lumbar Exercises: Aerobic  Tread Mill  2.0 mph x 5 min (with intermittent UE support)       Lumbar Exercises: Supine   Straight Leg Raise  5 reps   2 sets     Lumbar Exercises: Prone   Opposite Arm/Leg Raise  10 reps;Right arm/Left leg;Left arm/Right leg    Other Prone Lumbar Exercises  pelvic press with bilat knee bend x 5 reps.       Lumbar Exercises: Quadruped   Other Quadruped Lumbar Exercises  hip ext with toe tap x 10 ea alternating                   PT Long Term Goals - 02/10/19 1358      PT LONG TERM GOAL #1   Title  Ind with HEP to improve strength and flexibility    Time  6    Period  Weeks    Status  On-going      PT LONG TERM GOAL #2   Title  Patient to report no pain in LLE with ADLS    Baseline  pt reports pain up to 3-4/10 in lower Lt leg with bathing and shopping (02/10/19)    Time  6    Period  Weeks    Status  On-going      PT LONG TERM GOAL #3   Title  Patient to demo 5/5 LLE strength to normalize ADLS    Time  6    Period  Weeks    Status  On-going      PT LONG TERM GOAL #4   Title  Patient  able to perform ADLS including sitting with low back pain 2/10 or less.    Baseline  able to sit, but reports needing to reposition frequently (02/04/19)    Time  6    Period  Weeks    Status  On-going      PT LONG TERM GOAL #5   Title  Patient able to ambulate without gait deviations    Baseline  Rt lateral lean with gait (observed on treadmill)    Time  6    Period  Weeks    Status  On-going            Plan - 02/10/19 1443    Clinical Impression Statement  Good response to DN last session.  Pt painfree during session. Pt demonstrates continued weakness in LLE.  She requires cues throughout session for improved body mechanics.  Reviewed body mechanics for work positions at work and with transitional movements.  Pt making gradual progress towards goals.    Comorbidities  sarcoidosis, bulging discs, recent hysterectomy    Rehab Potential  Excellent    PT Frequency  2x / week    PT Duration  6 weeks    PT Treatment/Interventions  ADLs/Self Care Home Management;Electrical Stimulation;Cryotherapy;Moist Heat;Traction;Ultrasound;Therapeutic activities;Therapeutic exercise;Balance training;Neuromuscular re-education;Patient/family education;Dry needling;Manual techniques;Taping    PT Next Visit Plan  continue core and lumbar strengthening. DN and manual therapy as indicated.    PT Home Exercise Plan  [email or web address]; pelvic press series       Patient will benefit from skilled therapeutic intervention in order to improve the following deficits and impairments:  Pain, Decreased activity tolerance, Impaired flexibility, Decreased balance, Decreased strength, Postural dysfunction  Visit Diagnosis: Radiculopathy, lumbar region  Muscle weakness (generalized)  Unsteadiness on feet     Problem List Patient Active Problem List   Diagnosis Date Noted  . Post-operative state 12/10/2018  . Cervical  polyp 05/31/2018  . Uterine leiomyoma 05/31/2018  . Myofascial pain 05/31/2018  . Lichen  simplex chronicus 05/01/2018  . Chronic fatigue 05/01/2018  . Hypercalcemia 04/29/2018  . Iron deficiency anemia 04/29/2018  . Normocytic anemia 04/26/2018  . Vitamin D deficiency 04/26/2018  . Left lumbar radiculopathy 04/26/2018  . Intermenstrual bleeding 04/26/2018  . Hypertension goal BP (blood pressure) < 130/80 04/26/2018  . Tinea pedis of both feet 07/22/2015  . Sarcoidosis 09/09/2014  . Uveitis 06/03/2014   Kerin Perna, PTA 02/10/19 3:02 PM  [place], [place], [address] Phone: [phone]   Fax:  [phone]  Name: ZHANAE BARGERON MRN: [id number] Date of Birth: 06/02/1975

## 2019-02-20 ENCOUNTER — Other Ambulatory Visit: Payer: Self-pay

## 2019-02-20 ENCOUNTER — Ambulatory Visit (INDEPENDENT_AMBULATORY_CARE_PROVIDER_SITE_OTHER): Payer: 59 | Admitting: Physical Therapy

## 2019-02-20 DIAGNOSIS — M6281 Muscle weakness (generalized): Secondary | ICD-10-CM | POA: Diagnosis not present

## 2019-02-20 DIAGNOSIS — M5416 Radiculopathy, lumbar region: Secondary | ICD-10-CM | POA: Diagnosis not present

## 2019-02-20 NOTE — Patient Instructions (Signed)
[email or web address]  Access Code: [email or web address]  URL: [email or web address]  Date: 02/20/2019  Prepared by: Madelyn Flavors   Exercises Prone Press Up - 10 reps - 3 sets - 1x daily - 7x weekly Seated Piriformis Stretch - 3 reps - 1 sets - 30-60 sec hold - 2x daily - 7x weekly Seated Piriformis Stretch with Trunk Bend - 3 reps - 1 sets - 30-60 sec hold - 2x daily - 7x weekly Hip Extension with Resistance Loop - 10 reps - 3 sets - 1x daily - 7x weekly Standing Hip Abduction Kicks - 10 reps - 3 sets - 1x daily - 7x weekly Standing Heel Raises - 10 reps - 3 sets - 1x daily - 7x weekly Prone Alternating Arm and Leg Lifts - 10 reps - 3 sets - 1x daily - 7x weekly Quadruped Alternating Leg Extensions - 10 reps - 3 sets - 3 sec hold - 1x daily - 7x weekly Mini Squat - 10 reps - 3 sets - 1x daily - 7x weekly Patient Education Posture and Body Mechanics Trigger Point Dry Needling

## 2019-02-20 NOTE — Therapy (Signed)
[place], [place], [address] Phone: [phone]   Fax:  [phone]  Physical Therapy Treatment  Patient Details  Name: Marissa Chaney MRN: [id number] Date of Birth: Dec 16, 1975 Referring Provider (PT): Dr. Dianah Field   Encounter Date: 02/20/2019  PT End of Session - 02/20/19 1536    Visit Number  9    Number of Visits  12    Date for PT Re-Evaluation  02/27/19    PT Start Time  1536    PT Stop Time  1635    PT Time Calculation (min)  59 min    Activity Tolerance  Patient tolerated treatment well    Behavior During Therapy  [facility] for tasks assessed/performed       Past Medical History:  Diagnosis Date  . Cervical polyp 05/31/2018  . Hypertension   . Iron deficiency anemia 04/29/2018  . Sarcoidosis     Past Surgical History:  Procedure Laterality Date  . NO PAST SURGERIES    . VAGINAL HYSTERECTOMY Bilateral 12/10/2018   Procedure: HYSTERECTOMY VAGINAL WITH SALPINGECTOMY;  Surgeon: Emily Filbert, MD;  Location: [facility];  Service: Gynecology;  Laterality: Bilateral;    There were no vitals filed for this visit.  Subjective Assessment - 02/20/19 1536    Subjective  Patient having increased pain into left hip since return to work 02/11/19. She modified work station to avoid twisting, but was unable to get an ergonomic chair yet.Marland Kitchen    Pertinent History  sarcoidosis, bulging discs L4/5 and L5/S1, hysterectomy 12/10/18    Diagnostic tests  MRI - disc bulge L4/5, L5/S1    Currently in Pain?  Yes    Pain Score  6     Pain Location  Buttocks    Pain Orientation  Left    Pain Descriptors / Indicators  Tightness                       [facility] Adult PT Treatment/Exercise - 02/20/19 0001      Self-Care   ADL's  return demo of body mechancics from last visit that pt has been using over the past week      Lumbar Exercises: Redbird  1 rep;30 seconds      Lumbar Exercises:  Standing   Other Standing Lumbar Exercises  foundation training basic position reviewed with patient and then we worked on the first portion of the 12 minute video with good mornings and OH lift.     Other Standing Lumbar Exercises  founder position standing against wall x 15      Modalities   Modalities  Electrical Stimulation;Moist Heat      Moist Heat Therapy   Number Minutes Moist Heat  10 Minutes    Moist Heat Location  Hip      Electrical Stimulation   Electrical Stimulation Location  bilat lower lumbar paraspinals and left gluteals    Electrical Stimulation Action  IFC in right SDLY    Electrical Stimulation Parameters  x 10 min to tolerance    Electrical Stimulation Goals  Pain      Manual Therapy   Manual Therapy  Soft tissue mobilization    Manual therapy comments  skilled palpation and monitoring of soft tissue during DN      Joint Mobilization  PA mobs GD III to L4/5/S1       Trigger Point Dry Needling - 02/20/19 0001  Consent Given?  Yes    Education Handout Provided  Previously provided    Muscles Treated Back/Hip  Gluteus minimus;Gluteus medius;Piriformis;Lumbar multifidi    Dry Needling Comments  left    Gluteus Minimus Response  Twitch response elicited;Palpable increased muscle length    Gluteus Medius Response  Palpable increased muscle length    Piriformis Response  Palpable increased muscle length           PT Education - 02/20/19 1648    Education Details  HEP progressed; Foundation Training 12 min video (partial)    Person(s) Educated  Patient    Methods  Explanation;Demonstration;Handout    Comprehension  Verbalized understanding;Returned demonstration          PT Long Term Goals - 02/10/19 1358      PT LONG TERM GOAL #1   Title  Ind with HEP to improve strength and flexibility    Time  6    Period  Weeks    Status  On-going      PT LONG TERM GOAL #2   Title  Patient to report no pain in LLE with ADLS    Baseline  pt reports pain  up to 3-4/10 in lower Lt leg with bathing and shopping (02/10/19)    Time  6    Period  Weeks    Status  On-going      PT LONG TERM GOAL #3   Title  Patient to demo 5/5 LLE strength to normalize ADLS    Time  6    Period  Weeks    Status  On-going      PT LONG TERM GOAL #4   Title  Patient able to perform ADLS including sitting with low back pain 2/10 or less.    Baseline  able to sit, but reports needing to reposition frequently (02/04/19)    Time  6    Period  Weeks    Status  On-going      PT LONG TERM GOAL #5   Title  Patient able to ambulate without gait deviations    Baseline  Rt lateral lean with gait (observed on treadmill)    Time  6    Period  Weeks    Status  On-going            Plan - 02/20/19 1650    Clinical Impression Statement  Patient presents with increased pain today after returning to work last week. 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 Initial founder position went well, but poor form with good mornings and we could not include forward bending due to discs. Patient responded well to DN especially in glut minimus. Decreased pain reported at end of treatment.    Comorbidities  sarcoidosis, bulging discs, recent hysterectomy    PT Treatment/Interventions  ADLs/Self Care Home Management;Electrical Stimulation;Cryotherapy;Moist Heat;Traction;Ultrasound;Therapeutic activities;Therapeutic exercise;Balance training;Neuromuscular re-education;Patient/family education;Dry needling;Manual techniques;Taping    PT Next Visit Plan  Review Foundation Training TE; continue core and lumbar strengthening. DN and manual therapy as indicated.    PT Home Exercise Plan  [id number]; pelvic press series       Patient will benefit from skilled therapeutic intervention in order to improve the following deficits and impairments:  Pain, Decreased activity tolerance, Impaired flexibility, Decreased  balance, Decreased strength, Postural dysfunction  Visit Diagnosis: Radiculopathy, lumbar region  Muscle weakness (generalized)     Problem List Patient Active Problem  List   Diagnosis Date Noted  . Post-operative state 12/10/2018  . Cervical polyp 05/31/2018  . Uterine leiomyoma 05/31/2018  . Myofascial pain 05/31/2018  . Lichen simplex chronicus 05/01/2018  . Chronic fatigue 05/01/2018  . Hypercalcemia 04/29/2018  . Iron deficiency anemia 04/29/2018  . Normocytic anemia 04/26/2018  . Vitamin D deficiency 04/26/2018  . Left lumbar radiculopathy 04/26/2018  . Intermenstrual bleeding 04/26/2018  . Hypertension goal BP (blood pressure) < 130/80 04/26/2018  . Tinea pedis of both feet 07/22/2015  . Sarcoidosis 09/09/2014  . Uveitis 06/03/2014    Madelyn Flavors PT 02/20/2019, 4:58 PM  [facility] [place], [place], [address] Phone: [phone]   Fax:  [phone]  Name: Marissa Chaney MRN: [id number] Date of Birth: 01-07-1976

## 2019-02-21 ENCOUNTER — Encounter: Payer: Self-pay | Admitting: Physical Therapy

## 2019-02-21 ENCOUNTER — Ambulatory Visit (INDEPENDENT_AMBULATORY_CARE_PROVIDER_SITE_OTHER): Payer: 59 | Admitting: Physical Therapy

## 2019-02-21 DIAGNOSIS — M6281 Muscle weakness (generalized): Secondary | ICD-10-CM | POA: Diagnosis not present

## 2019-02-21 DIAGNOSIS — M5416 Radiculopathy, lumbar region: Secondary | ICD-10-CM

## 2019-02-21 NOTE — Therapy (Addendum)
[place], [place], [address] Phone: [phone]   Fax:  [phone]  Physical Therapy Treatment  Patient Details  Name: Marissa Chaney MRN: [id number] Date of Birth: May 26, 1975 Referring Provider (PT): Dr. Dianah Field   Encounter Date: 02/21/2019  PT End of Session - 02/21/19 1439    Visit Number  10    Number of Visits  12    Date for PT Re-Evaluation  02/27/19    PT Start Time  [id number]    PT Stop Time  1538    PT Time Calculation (min)  53 min    Activity Tolerance  Patient tolerated treatment well    Behavior During Therapy  [facility] for tasks assessed/performed       Past Medical History:  Diagnosis Date  . Cervical polyp 05/31/2018  . Hypertension   . Iron deficiency anemia 04/29/2018  . Sarcoidosis     Past Surgical History:  Procedure Laterality Date  . NO PAST SURGERIES    . VAGINAL HYSTERECTOMY Bilateral 12/10/2018   Procedure: HYSTERECTOMY VAGINAL WITH SALPINGECTOMY;  Surgeon: Emily Filbert, MD;  Location: [facility];  Service: Gynecology;  Laterality: Bilateral;    There were no vitals filed for this visit.  Subjective Assessment - 02/21/19 1448    Subjective  Pt states she had nerve pain shooting down her leg this morning. Pt states that DN and e-stim helped reduce back pain yesterday.    Currently in Pain?  Yes    Pain Score  3     Pain Location  Leg    Pain Orientation  Left    Pain Descriptors / Indicators  Discomfort    Pain Type  Chronic pain    Aggravating Factors   bending forward, sitting for prolonged period of time at work    Pain Relieving Factors  DN, e-stim, stretches      [facility] Adult PT Treatment/Exercise - 02/21/19 0001      Lumbar Exercises: Stretches   Passive Hamstring Stretch  Left;2 reps;20 seconds   supine with strap    Piriformis Stretch  Left;2 reps;20 seconds      Lumbar Exercises: Aerobic   Tread Mill  2.2 mph x 5 min      Lumbar Exercises: Standing   Wall Slides  10 reps   out/in from chest with 2lb    Wall Slides Limitations  cues initally for form    Other Standing Lumbar Exercises  foundation training good mornings x 10; OH lifts 2 sets of 5; lunge stretches (with unilateral arm overhead reaching opp direction) 2 sets of 10 heel pulses and 1 set of 5 heel pulses   cues for technique and to engage core     Lumbar Exercises: Prone   Opposite Arm/Leg Raise  Right arm/Left leg;Left arm/Right leg;10 reps    Opposite Arm/Leg Raise Limitations  cues for technique    Other Prone Lumbar Exercises  prone press ups x 10 reps      Lumbar Exercises: Quadruped   Other Quadruped Lumbar Exercises  hip ext with toe tap  2 sets of 5 on Lt   5 reps with SLR     Knee/Hip Exercises: Standing   Wall Squat  --      Knee/Hip Exercises: Supine   Straight Leg Raises  Strengthening;Left;2 sets;5 sets      Moist Heat Therapy   Number Minutes Moist Heat  10 Minutes    Moist Heat Location  Lumbar Spine      Electrical Stimulation   Electrical Stimulation Location  bilat lower lumbar paraspinals and left gluteals    Electrical Stimulation Action  IFC    Electrical Stimulation Parameters  to pt tolerance    Electrical Stimulation Goals  Pain       PT Long Term Goals - 02/10/19 1358      PT LONG TERM GOAL #1   Title  Ind with HEP to improve strength and flexibility    Time  6    Period  Weeks    Status  On-going      PT LONG TERM GOAL #2   Title  Patient to report no pain in LLE with ADLS    Baseline  pt reports pain up to 3-4/10 in lower Lt leg with bathing and shopping (02/10/19)    Time  6    Period  Weeks    Status  On-going      PT LONG TERM GOAL #3   Title  Patient to demo 5/5 LLE strength to normalize ADLS    Time  6    Period  Weeks    Status  On-going      PT LONG TERM GOAL #4   Title  Patient able to perform ADLS including sitting with low back pain 2/10 or less.    Baseline  able to sit, but reports needing to reposition  frequently (02/04/19)    Time  6    Period  Weeks    Status  On-going      PT LONG TERM GOAL #5   Title  Patient able to ambulate without gait deviations    Baseline  Rt lateral lean with gait (observed on treadmill)    Time  6    Period  Weeks    Status  On-going            Plan - 02/21/19 1601    Clinical Impression Statement  Pt with positive response to DN and e-stim in last treatment session; reduced LBP. Pt responded well to exercises with no reports of increased pain. Reduction in pain with moist hot pack and e-stim. Pt continues to make progress toward LTG #3; will continue to benefit from skilled PT for improved functional mobility and management of radicular symptoms.    Comorbidities  sarcoidosis, bulging discs, recent hysterectomy    Rehab Potential  Excellent    PT Frequency  2x / week    PT Duration  6 weeks    PT Treatment/Interventions  ADLs/Self Care Home Management;Electrical Stimulation;Cryotherapy;Moist Heat;Traction;Ultrasound;Therapeutic activities;Therapeutic exercise;Balance training;Neuromuscular re-education;Patient/family education;Dry needling;Manual techniques;Taping    PT Next Visit Plan  continue core and lumbar strengthening. DN and manual therapy as indicated. --end of POC, assess goals.    PT Home Exercise Plan  [id number]; pelvic press series       Patient will benefit from skilled therapeutic intervention in order to improve the following deficits and impairments:  Pain, Decreased activity tolerance, Impaired flexibility, Decreased balance, Decreased strength, Postural dysfunction  Visit Diagnosis: Radiculopathy, lumbar region  Muscle weakness (generalized)     Problem List Patient Active Problem List   Diagnosis Date Noted  . Post-operative state 12/10/2018  . Cervical polyp 05/31/2018  . Uterine leiomyoma 05/31/2018  . Myofascial pain 05/31/2018  . Lichen simplex chronicus 05/01/2018  . Chronic fatigue 05/01/2018  . Hypercalcemia  04/29/2018  . Iron deficiency anemia 04/29/2018  . Normocytic anemia 04/26/2018  . Vitamin D deficiency 04/26/2018  .  Left lumbar radiculopathy 04/26/2018  . Intermenstrual bleeding 04/26/2018  . Hypertension goal BP (blood pressure) < 130/80 04/26/2018  . Tinea pedis of both feet 07/22/2015  . Sarcoidosis 09/09/2014  . Uveitis 06/03/2014    Gardiner Rhyme, SPTA 02/21/2019, 4:12 PM  Kerin Perna, PTA 02/21/19 4:38 PM  [facility] [place], [place], [address] Phone: [phone]   Fax:  [phone]  Name: EASTON GALES MRN: [id number] Date of Birth: 19-Jun-1975

## 2019-02-27 ENCOUNTER — Other Ambulatory Visit: Payer: Self-pay

## 2019-02-27 ENCOUNTER — Ambulatory Visit (INDEPENDENT_AMBULATORY_CARE_PROVIDER_SITE_OTHER): Payer: 59 | Admitting: Physical Therapy

## 2019-02-27 ENCOUNTER — Encounter: Payer: Self-pay | Admitting: Physical Therapy

## 2019-02-27 DIAGNOSIS — M6281 Muscle weakness (generalized): Secondary | ICD-10-CM | POA: Diagnosis not present

## 2019-02-27 DIAGNOSIS — M5416 Radiculopathy, lumbar region: Secondary | ICD-10-CM | POA: Diagnosis not present

## 2019-02-27 NOTE — Therapy (Signed)
[place], [place], [address] Phone: [phone]   Fax:  [phone]  Physical Therapy Treatment  Patient Details  Name: Marissa Chaney MRN: [id number] Date of Birth: Jun 19, 1975 Referring Provider (PT): Dr. Dianah Field   Encounter Date: 02/27/2019  PT End of Session - 02/27/19 1535    Visit Number  11    Number of Visits  20    Date for PT Re-Evaluation  03/27/19    PT Start Time  3007    PT Stop Time  1623    PT Time Calculation (min)  52 min    Activity Tolerance  Patient tolerated treatment well    Behavior During Therapy  [facility] for tasks assessed/performed       Past Medical History:  Diagnosis Date  . Cervical polyp 05/31/2018  . Hypertension   . Iron deficiency anemia 04/29/2018  . Sarcoidosis     Past Surgical History:  Procedure Laterality Date  . NO PAST SURGERIES    . VAGINAL HYSTERECTOMY Bilateral 12/10/2018   Procedure: HYSTERECTOMY VAGINAL WITH SALPINGECTOMY;  Surgeon: Emily Filbert, MD;  Location: [facility];  Service: Gynecology;  Laterality: Bilateral;    There were no vitals filed for this visit.  Subjective Assessment - 02/27/19 1537    Subjective  Patient reports she is having pain mainly in left gluteals and hip, but still feeling down the leg in the morning and intermittently during the day. She denies pain in her low back today.    Pertinent History  sarcoidosis, bulging discs L4/5 and L5/S1, hysterectomy 12/10/18    Diagnostic tests  MRI - disc bulge L4/5, L5/S1    Patient Stated Goals  to get rid of pain and increase strength    Currently in Pain?  Yes    Pain Score  2     Pain Location  Hip    Pain Orientation  Left    Pain Descriptors / Indicators  Aching    Pain Type  Chronic pain    Pain Radiating Towards  LLE to lateral ankle    Pain Onset  More than a month ago    Pain Frequency  Constant    Aggravating Factors   bending, sitting for prolonged periods    Pain  Relieving Factors  DN, estim, stretches    Effect of Pain on Daily Activities  balance off when walking, pain with ADLS         [facility] PT Assessment - 02/27/19 0001      Strength   Strength Assessment Site  Hip    Right/Left Hip  Left    Left Hip Flexion  4+/5    Left Hip Extension  4+/5    Left Knee Flexion  4+/5   some cogwheeling   Left Ankle Inversion  5/5   no cogwheel as with eval   Left Ankle Eversion  4/5   no cogwheel as with eval     Palpation   Palpation comment  TPs in left glut min/med                   [facility] Adult PT Treatment/Exercise - 02/27/19 0001      Ambulation/Gait   Ambulation/Gait  Yes    Ambulation/Gait Assistance  7: Independent    Ambulation Distance (Feet)  80 Feet    Gait Pattern  Scissoring;Lateral trunk lean to right    Ambulation Surface  Level    Gait Comments  mild intermittent scissoring      Self-Care   Self-Care  Other Self-Care Comments    Other Self-Care Comments   discussed POC and possible injection outcomes, results of assessment  today      Lumbar Exercises: Aerobic   Tread Mill  2.2 mph x 5 min      Lumbar Exercises: Standing   Wall Slides  20 reps   out/in from chest with 2lb    Wall Slides Limitations  wt shifts right, so 2nd set with Rt foot on black oval      Modalities   Modalities  Electrical Stimulation;Moist Heat      Moist Heat Therapy   Number Minutes Moist Heat  10 Minutes    Moist Heat Location  Hip;Lumbar Spine      Electrical Stimulation   Electrical Stimulation Location  bil lumbar, left gluteals    Electrical Stimulation Action  premod in [facility]    Electrical Stimulation Parameters  x 10 min to tolerance    Electrical Stimulation Goals  Pain      Manual Therapy   Manual Therapy  Soft tissue mobilization    Manual therapy comments  skilled palpation and monitoring of soft tissue during DN         Trigger Point Dry Needling - 02/27/19 0001    Consent Given?  Yes    Education Handout  Provided  Previously provided    Muscles Treated Back/Hip  Gluteus minimus;Gluteus medius    Dry Needling Comments  left    Gluteus Minimus Response  Twitch response elicited;Palpable increased muscle length    Gluteus Medius Response  Twitch response elicited;Palpable increased muscle length                PT Long Term Goals - 02/27/19 1542      PT LONG TERM GOAL #1   Title  Ind with HEP to improve strength and flexibility    Status  Partially Met      PT LONG TERM GOAL #2   Title  Patient to report no pain in LLE with ADLS    Baseline  after 3-4 hours of standing at work she begins to feel into Lt leg. Also walking at store will feel but not piercing.    Status  On-going      PT LONG TERM GOAL #3   Title  Patient to demo 5/5 LLE strength to normalize ADLS    Status  On-going      PT LONG TERM GOAL #4   Title  Patient able to perform ADLS including sitting with low back pain 2/10 or less.    Baseline  able to sit, but reports needing to reposition frequently    Status  On-going      PT LONG TERM GOAL #5   Title  Patient able to ambulate without gait deviations    Baseline  small Rt lat lean and some intermittent scissoring with RLE    Status  On-going            Plan - 02/27/19 1615    Clinical Impression Statement  Patient presents today with reports of improvements overall, but with continued c/o left radicular pain, feeling of numbness and balance problems with gait. She is unable to stand on LLE independently and the leg shakes throughout. Normal SLS on right. Her strength has improved distally in the LLE since eval and cogwheeling response has ceased. She has not met her strength goal. She continues  to favor the LLE with gait and compensates with right trunk lean. She has had good results from DN in regards to pain. Overall she is progressing, but I'm concerned with the poor innervation of her leg muscles. Pt did not like traction.    Comorbidities   sarcoidosis, bulging discs, recent hysterectomy    PT Frequency  2x / week    PT Duration  4 weeks    PT Treatment/Interventions  ADLs/Self Care Home Management;Electrical Stimulation;Cryotherapy;Moist Heat;Traction;Ultrasound;Therapeutic activities;Therapeutic exercise;Balance training;Neuromuscular re-education;Patient/family education;Dry needling;Manual techniques;Taping    PT Next Visit Plan  continue LE, core and lumbar strengthening. DN and manual therapy as indicated.    PT Home Exercise Plan  [id number]; pelvic press series    Consulted and Agree with Plan of Care  Patient       Patient will benefit from skilled therapeutic intervention in order to improve the following deficits and impairments:  Pain, Decreased activity tolerance, Impaired flexibility, Decreased balance, Decreased strength, Postural dysfunction  Visit Diagnosis: Radiculopathy, lumbar region - Plan: PT plan of care cert/re-cert  Muscle weakness (generalized) - Plan: PT plan of care cert/re-cert     Problem List Patient Active Problem List   Diagnosis Date Noted  . Post-operative state 12/10/2018  . Cervical polyp 05/31/2018  . Uterine leiomyoma 05/31/2018  . Myofascial pain 05/31/2018  . Lichen simplex chronicus 05/01/2018  . Chronic fatigue 05/01/2018  . Hypercalcemia 04/29/2018  . Iron deficiency anemia 04/29/2018  . Normocytic anemia 04/26/2018  . Vitamin D deficiency 04/26/2018  . Left lumbar radiculopathy 04/26/2018  . Intermenstrual bleeding 04/26/2018  . Hypertension goal BP (blood pressure) < 130/80 04/26/2018  . Tinea pedis of both feet 07/22/2015  . Sarcoidosis 09/09/2014  . Uveitis 06/03/2014    Madelyn Flavors PT 02/27/2019, 4:54 PM  [facility] [place], [place], [address] Phone: [phone]   Fax:  [phone]  Name: Marissa Chaney MRN: [id number] Date of Birth: 11/30/1975

## 2019-02-28 ENCOUNTER — Encounter: Payer: Self-pay | Admitting: Physical Therapy

## 2019-02-28 ENCOUNTER — Other Ambulatory Visit: Payer: Self-pay

## 2019-02-28 ENCOUNTER — Ambulatory Visit (INDEPENDENT_AMBULATORY_CARE_PROVIDER_SITE_OTHER): Payer: 59 | Admitting: Physical Therapy

## 2019-02-28 ENCOUNTER — Encounter: Payer: Self-pay | Admitting: Sports Medicine

## 2019-02-28 ENCOUNTER — Ambulatory Visit (INDEPENDENT_AMBULATORY_CARE_PROVIDER_SITE_OTHER): Payer: 59 | Admitting: Sports Medicine

## 2019-02-28 DIAGNOSIS — M6281 Muscle weakness (generalized): Secondary | ICD-10-CM

## 2019-02-28 DIAGNOSIS — M5416 Radiculopathy, lumbar region: Secondary | ICD-10-CM | POA: Diagnosis not present

## 2019-02-28 DIAGNOSIS — R2681 Unsteadiness on feet: Secondary | ICD-10-CM

## 2019-02-28 NOTE — Progress Notes (Signed)
Subjective:    CC: Lumbar radiculopathy  HPI: Marissa Chaney returns, she is had almost a month of formal physical therapy, she continues to have left-sided radicular symptoms down her leg, to the anterolateral left lower leg, sometimes to the middle toes, sometimes to the small toe, no bowel or bladder dysfunction, saddle numbness, constitutional symptoms.  She does desire to continue formal physical therapy before we proceed with interventional treatment.  I reviewed the past medical history, family history, social history, surgical history, and allergies today and no changes were needed.  Please see the problem list section below in epic for further details.  Past Medical History: Past Medical History:  Diagnosis Date  . Cervical polyp 05/31/2018  . Hypertension   . Iron deficiency anemia 04/29/2018  . Sarcoidosis    Past Surgical History: Past Surgical History:  Procedure Laterality Date  . NO PAST SURGERIES    . VAGINAL HYSTERECTOMY Bilateral 12/10/2018   Procedure: HYSTERECTOMY VAGINAL WITH SALPINGECTOMY;  Surgeon: Emily Filbert, MD;  Location: [facility];  Service: Gynecology;  Laterality: Bilateral;   Social History: Social History   Socioeconomic History  . Marital status: Married    Spouse name: Not on file  . Number of children: Not on file  . Years of education: Not on file  . Highest education level: Not on file  Occupational History  . Not on file  Tobacco Use  . Smoking status: Current Some Day Smoker    Types: Cigarettes  . Smokeless tobacco: Never Used  . Tobacco comment: 1-2 cig/day  Substance and Sexual Activity  . Alcohol use: Yes    Alcohol/week: 1.0 - 2.0 standard drinks    Types: 1 Glasses of wine per week  . Drug use: Never  . Sexual activity: Yes    Birth control/protection: None  Other Topics Concern  . Not on file  Social History Narrative  . Not on file   Social Determinants of Health   Financial Resource Strain: Low Risk   . Difficulty of Paying  Living Expenses: Not hard at all  Food Insecurity: No Food Insecurity  . Worried About Charity fundraiser in the Last Year: Never true  . Ran Out of Food in the Last Year: Never true  Transportation Needs: No Transportation Needs  . Lack of Transportation (Medical): No  . Lack of Transportation (Non-Medical): No  Physical Activity: Inactive  . Days of Exercise per Week: 0 days  . Minutes of Exercise per Session: 0 min  Stress: No Stress Concern Present  . Feeling of Stress : Not at all  Social Connections: Somewhat Isolated  . Frequency of Communication with Friends and Family: More than three times a week  . Frequency of Social Gatherings with Friends and Family: More than three times a week  . Attends Religious Services: Never  . Active Member of Clubs or Organizations: No  . Attends Archivist Meetings: Not asked  . Marital Status: Married   Family History: Family History  Problem Relation Age of Onset  . Hypertension Mother    Allergies: Allergies  Allergen Reactions  . Blueberry [Vaccinium Angustifolium] Nausea And Vomiting   Medications: See med rec.  Review of Systems: No fevers, chills, night sweats, weight loss, chest pain, or shortness of breath.   Objective:    General: Well Developed, well nourished, and in no acute distress.  Neuro: Alert and oriented x3, extra-ocular muscles intact, sensation grossly intact.  HEENT: Normocephalic, atraumatic, pupils equal round reactive to  light, neck supple, no masses, no lymphadenopathy, thyroid nonpalpable.  Skin: Warm and dry, no rashes. Cardiac: Regular rate and rhythm, no murmurs rubs or gallops, no lower extremity edema.  Respiratory: Clear to auscultation bilaterally. Not using accessory muscles, speaking in full sentences. Back Exam:  Inspection: Unremarkable  Motion: Flexion 45 deg, Extension 45 deg, Side Bending to 45 deg bilaterally,  Rotation to 45 deg bilaterally  SLR laying: Negative  XSLR laying:  Negative  Palpable tenderness: None. FABER: negative. Sensory change: Gross sensation intact to all lumbar and sacral dermatomes.  Reflexes: 2+ at both patellar tendons, 2+ at achilles tendons, Babinski's downgoing.  Strength at foot  Plantar-flexion: 5/5 Dorsi-flexion: 5/5 Eversion: 5/5 Inversion: 5/5  Leg strength  Quad: 5/5 Hamstring: 5/5 Hip flexor: 5/5 Hip abductors: 5/5  Gait unremarkable.  Impression and Recommendations:    Left lumbar radiculopathy Subjective weakness, clinically she has good strength. Distribution spans L4-S1, she will be more cognizant of exactly where she feels her discomfort. Continue gabapentin up taper, she would like to do additional PT until the end of December before proceeding with epidural, she can simply call me at that time and I am happy to order it.   ___________________________________________ Gwen Her. Dianah Field, M.D., ABFM., CAQSM. Primary Care and Sports Medicine [facility]  Adjunct Professor of [place] [employer] [employer]

## 2019-02-28 NOTE — Therapy (Signed)
[place] [place] [facility] [place], [place], [address] Phone: [phone]   Fax:  [phone]  Physical Therapy Treatment  Patient Details  Name: Marissa Chaney MRN: [id number] Date of Birth: Aug 19, 1975 Referring Provider (PT): Dr. Dianah Field   Encounter Date: 02/28/2019  PT End of Session - 02/28/19 1332    Visit Number  12    Number of Visits  20    Date for PT Re-Evaluation  03/27/19    PT Start Time  2094    PT Stop Time  1110    PT Time Calculation (min)  55 min    Activity Tolerance  Patient tolerated treatment well    Behavior During Therapy  [facility] for tasks assessed/performed       Past Medical History:  Diagnosis Date  . Cervical polyp 05/31/2018  . Hypertension   . Iron deficiency anemia 04/29/2018  . Sarcoidosis     Past Surgical History:  Procedure Laterality Date  . NO PAST SURGERIES    . VAGINAL HYSTERECTOMY Bilateral 12/10/2018   Procedure: HYSTERECTOMY VAGINAL WITH SALPINGECTOMY;  Surgeon: Emily Filbert, MD;  Location: [facility];  Service: Gynecology;  Laterality: Bilateral;    There were no vitals filed for this visit.  Subjective Assessment - 02/28/19 1103    Subjective  Pt arriving today reporting 3/10 pain in low back. Pt also reported mid to upper back pain when waking up this morning.    Pertinent History  sarcoidosis, bulging discs L4/5 and L5/S1, hysterectomy 12/10/18    Diagnostic tests  MRI - disc bulge L4/5, L5/S1    Patient Stated Goals  to get rid of pain and increase strength    Currently in Pain?  Yes    Pain Score  3     Pain Location  Back    Pain Orientation  Right;Left    Pain Descriptors / Indicators  Aching    Pain Type  Chronic pain    Pain Onset  More than a month ago    Multiple Pain Sites  No                       [facility] Adult PT Treatment/Exercise - 02/28/19 0001      Exercises   Exercises  Lumbar      Lumbar Exercises: Stretches   Active Hamstring  Stretch  Right;Left;2 reps;30 seconds    Piriformis Stretch  2 reps;30 seconds      Lumbar Exercises: Aerobic   Tread Mill  2.2 mph x 8 min      Lumbar Exercises: Standing   Wall Slides  20 reps   out/in from chest with 2lb    Wall Slides Limitations  wt shifts right, so 2nd set with Rt foot on black oval      Lumbar Exercises: Quadruped   Madcat/Old Horse  10 reps;Limitations    Madcat/Old Horse Limitations  verbal cues needed for increased posterior tilt    Opposite Arm/Leg Raise  Right arm/Left leg;Left arm/Right leg;5 reps;5 seconds      Modalities   Modalities  Electrical Stimulation;Moist Heat      Moist Heat Therapy   Number Minutes Moist Heat  15 Minutes    Moist Heat Location  Lumbar Spine      Electrical Stimulation   Electrical Stimulation Location  bilateral lumbar paraspinals    Electrical Stimulation Action  IFC, 80-150 Hz x 15 minutes, intensity to tolerance    Electrical  Stimulation Goals  Pain      Manual Therapy   Manual Therapy  Soft tissue mobilization    Manual therapy comments  skilled palpation and monitoring of soft tissue during DN               PT Education - 02/28/19 1331    Education Details  Quadraped stretching when first waking up    Person(s) Educated  Patient    Methods  Explanation;Demonstration    Comprehension  Returned demonstration;Verbalized understanding          PT Long Term Goals - 02/28/19 1336      PT LONG TERM GOAL #1   Title  Ind with HEP to improve strength and flexibility    Time  6    Period  Weeks    Status  Partially Met      PT LONG TERM GOAL #2   Title  Patient to report no pain in LLE with ADLS    Baseline  after 3-4 hours of standing at work she begins to feel into Lt leg. Also walking at store will feel but not piercing.    Time  6    Period  Weeks    Status  On-going      PT LONG TERM GOAL #3   Title  Patient to demo 5/5 LLE strength to normalize ADLS    Time  6    Status  On-going      PT  LONG TERM GOAL #4   Title  Patient able to perform ADLS including sitting with low back pain 2/10 or less.    Baseline  able to sit, but reports needing to reposition frequently    Time  6    Period  Weeks    Status  On-going      PT LONG TERM GOAL #5   Title  Patient able to ambulate without gait deviations    Baseline  small Rt lat lean and some intermittent scissoring with RLE    Time  6    Period  Weeks    Status  On-going            Plan - 02/28/19 1333    Clinical Impression Statement  Pt presenting today with overall improvements. Pt did report pain in mid and upper back initially this morning, but reported after being up and about her pain was relieved. Pt still progressing toward her LTG's, Pt tolerating core and strengthening exercises, streching, STM, E-stim and heat well reporting no pain at end of session. Continue skilled PT.    Personal Factors and Comorbidities  Comorbidity 3+    Comorbidities  sarcoidosis, bulging discs, recent hysterectomy    Examination-Activity Limitations  Bend;Lift;Sit    Stability/Clinical Decision Making  Stable/Uncomplicated    Rehab Potential  Excellent    PT Frequency  2x / week    PT Duration  4 weeks    PT Treatment/Interventions  ADLs/Self Care Home Management;Electrical Stimulation;Cryotherapy;Moist Heat;Traction;Ultrasound;Therapeutic activities;Therapeutic exercise;Balance training;Neuromuscular re-education;Patient/family education;Dry needling;Manual techniques;Taping    PT Next Visit Plan  continue LE, core and lumbar strengthening. DN and manual therapy as indicated.    PT Home Exercise Plan  [id number]; pelvic press series, cat/camel stretching    Consulted and Agree with Plan of Care  Patient       Patient will benefit from skilled therapeutic intervention in order to improve the following deficits and impairments:  Pain, Decreased activity tolerance, Impaired flexibility, Decreased balance, Decreased strength, Postural  dysfunction  Visit Diagnosis: Radiculopathy, lumbar region  Muscle weakness (generalized)  Unsteadiness on feet     Problem List Patient Active Problem List   Diagnosis Date Noted  . Post-operative state 12/10/2018  . Cervical polyp 05/31/2018  . Uterine leiomyoma 05/31/2018  . Myofascial pain 05/31/2018  . Lichen simplex chronicus 05/01/2018  . Chronic fatigue 05/01/2018  . Hypercalcemia 04/29/2018  . Iron deficiency anemia 04/29/2018  . Normocytic anemia 04/26/2018  . Vitamin D deficiency 04/26/2018  . Left lumbar radiculopathy 04/26/2018  . Intermenstrual bleeding 04/26/2018  . Hypertension goal BP (blood pressure) < 130/80 04/26/2018  . Tinea pedis of both feet 07/22/2015  . Sarcoidosis 09/09/2014  . Uveitis 06/03/2014    Oretha Caprice, PT 02/28/2019, 1:38 PM  [facility] [place], [place], [address] Phone: [phone]   Fax:  [phone]  Name: JOHNNY LATU MRN: [id number] Date of Birth: January 20, 1976

## 2019-02-28 NOTE — Assessment & Plan Note (Signed)
Subjective weakness, clinically she has good strength. Distribution spans L4-S1, she will be more cognizant of exactly where she feels her discomfort. Continue gabapentin up taper, she would like to do additional PT until the end of December before proceeding with epidural, she can simply call me at that time and I am happy to order it.

## 2019-03-06 ENCOUNTER — Ambulatory Visit (INDEPENDENT_AMBULATORY_CARE_PROVIDER_SITE_OTHER): Payer: Managed Care, Other (non HMO) | Admitting: Physical Therapy

## 2019-03-06 ENCOUNTER — Encounter: Payer: Self-pay | Admitting: Physical Therapy

## 2019-03-06 ENCOUNTER — Other Ambulatory Visit: Payer: Self-pay

## 2019-03-06 DIAGNOSIS — M5416 Radiculopathy, lumbar region: Secondary | ICD-10-CM | POA: Diagnosis not present

## 2019-03-06 DIAGNOSIS — M6281 Muscle weakness (generalized): Secondary | ICD-10-CM

## 2019-03-06 NOTE — Therapy (Signed)
[place], [place], [address] Phone: [phone]   Fax:  [phone]  Physical Therapy Treatment  Patient Details  Name: Marissa Chaney MRN: [id number] Date of Birth: 12/26/1975 Referring Provider (PT): Dr. Dianah Field   Encounter Date: 03/06/2019  PT End of Session - 03/06/19 1542    Visit Number  13    Number of Visits  20    Date for PT Re-Evaluation  03/27/19    PT Start Time  2952    PT Stop Time  8413    PT Time Calculation (min)  41 min    Activity Tolerance  Patient tolerated treatment well;No increased pain    Behavior During Therapy  WFL for tasks assessed/performed       Past Medical History:  Diagnosis Date  . Cervical polyp 05/31/2018  . Hypertension   . Iron deficiency anemia 04/29/2018  . Sarcoidosis     Past Surgical History:  Procedure Laterality Date  . NO PAST SURGERIES    . VAGINAL HYSTERECTOMY Bilateral 12/10/2018   Procedure: HYSTERECTOMY VAGINAL WITH SALPINGECTOMY;  Surgeon: Emily Filbert, MD;  Location: [facility];  Service: Gynecology;  Laterality: Bilateral;    There were no vitals filed for this visit.  Subjective Assessment - 03/06/19 1532    Subjective  Pt reports she hasn't had any "piercing pain" down her LLE for the last 3 days.  She has aches, but not has constant as they use to be.  She now has pain in her upper back (between shoulder blades)    Diagnostic tests  MRI - disc bulge L4/5, L5/S1    Patient Stated Goals  to get rid of pain and increase strength    Currently in Pain?  Yes    Pain Score  5     Pain Location  Back    Pain Orientation  Right;Left    Pain Descriptors / Indicators  Aching;Sharp    Aggravating Factors   reaching forward, sitting at desk    Pain Relieving Factors  ibuprofent, estim, DN         [facility] PT Assessment - 03/06/19 0001      Assessment   Medical Diagnosis  left lumbar radiculopathy    Referring Provider (PT)  Dr.  Dianah Field    Onset Date/Surgical Date  09/18/18    Next MD Visit  03/28/19    Prior Therapy  no      Strength   Strength Assessment Site  Hip    Right/Left Hip  Left    Left Hip Flexion  4+/5    Left Hip Extension  4+/5    Left Knee Flexion  --   5-/5   Left Ankle Eversion  4+/5   no cogwheel as with eval     [facility] Adult PT Treatment/Exercise - 03/06/19 0001      Self-Care   Other Self-Care Comments   reviewed self massage with ball to upper back muscles; pt returned demo with cues.       Lumbar Exercises: Stretches   Prone on Elbows Stretch  2 reps;20 seconds      Lumbar Exercises: Aerobic   Tread Mill  5 min up to 2.8 mph      Lumbar Exercises: Standing   Row  Both;10 reps   with 2 sec pause in retraction   Other Standing Lumbar Exercises  anti-rotation with red band x 10 reps (out in from core) each side.  Lumbar Exercises: Seated   Other Seated Lumbar Exercises  wood chop with red band x 5 reps each side.       Lumbar Exercises: Supine   Other Supine Lumbar Exercises  thoracic ext over 1/2 foam roll x 3 reps       Lumbar Exercises: Prone   Opposite Arm/Leg Raise  Right arm/Left leg;Left arm/Right leg;10 reps;2 seconds      Lumbar Exercises: Quadruped   Madcat/Old Horse  5 reps   5 sec hold   Straight Leg Raise  5 reps;5 seconds    Opposite Arm/Leg Raise Limitations  unable to keep back flat with opp arm/leg.       Knee/Hip Exercises: Stretches   Passive Hamstring Stretch  Right;Left;2 reps;30 seconds   supine with strap                 PT Long Term Goals - 03/06/19 1606      PT LONG TERM GOAL #1   Title  Ind with HEP to improve strength and flexibility    Time  6    Period  Weeks    Status  Partially Met      PT LONG TERM GOAL #2   Title  Patient to report no pain in LLE with ADLS    Baseline  after 4-5 hours of standing at work she begins to feel into Lt leg.    Time  6    Period  Weeks    Status  On-going      PT LONG TERM GOAL  #3   Title  Patient to demo 5/5 LLE strength to normalize ADLS    Time  6    Status  Partially Met      PT LONG TERM GOAL #4   Title  Patient able to perform ADLS including sitting with low back pain 2/10 or less.    Baseline  able to sit, but reports needing to reposition frequently    Time  6    Period  Weeks    Status  On-going      PT LONG TERM GOAL #5   Title  Patient able to ambulate without gait deviations    Baseline  small Rt lat lean and some intermittent scissoring with RLE    Time  6    Period  Weeks    Status  On-going            Plan - 03/06/19 1817    Clinical Impression Statement  Pt reporting overall reduction in back pain and LLE symptoms.  Upper back pain reduced with exercises and self massage (with cues).  Pt's LLE strength gradually improving. Pt tolerated all exercises well, with improved core engagement and no symptoms.  Pt will benefit from continued PT intervention to max functional mobility.    Personal Factors and Comorbidities  Comorbidity 3+    Comorbidities  sarcoidosis, bulging discs, recent hysterectomy    Examination-Activity Limitations  Bend;Lift;Sit    Stability/Clinical Decision Making  Stable/Uncomplicated    Rehab Potential  Excellent    PT Frequency  2x / week    PT Duration  4 weeks    PT Treatment/Interventions  ADLs/Self Care Home Management;Electrical Stimulation;Cryotherapy;Moist Heat;Traction;Ultrasound;Therapeutic activities;Therapeutic exercise;Balance training;Neuromuscular re-education;Patient/family education;Dry needling;Manual techniques;Taping    PT Next Visit Plan  continue LE, core and lumbar strengthening. DN and manual therapy as indicated.    PT Home Exercise Plan  [id number]; pelvic press series, cat/camel stretching    Consulted  and Agree with Plan of Care  Patient       Patient will benefit from skilled therapeutic intervention in order to improve the following deficits and impairments:  Pain, Decreased activity  tolerance, Impaired flexibility, Decreased balance, Decreased strength, Postural dysfunction  Visit Diagnosis: Radiculopathy, lumbar region  Muscle weakness (generalized)     Problem List Patient Active Problem List   Diagnosis Date Noted  . Post-operative state 12/10/2018  . Cervical polyp 05/31/2018  . Uterine leiomyoma 05/31/2018  . Myofascial pain 05/31/2018  . Lichen simplex chronicus 05/01/2018  . Chronic fatigue 05/01/2018  . Hypercalcemia 04/29/2018  . Iron deficiency anemia 04/29/2018  . Normocytic anemia 04/26/2018  . Vitamin D deficiency 04/26/2018  . Left lumbar radiculopathy 04/26/2018  . Intermenstrual bleeding 04/26/2018  . Hypertension goal BP (blood pressure) < 130/80 04/26/2018  . Tinea pedis of both feet 07/22/2015  . Sarcoidosis 09/09/2014  . Uveitis 06/03/2014   Kerin Perna, PTA 03/06/19 6:23 PM  [place], [place], [address] Phone: [phone]   Fax:  [phone]  Name: Marissa Chaney MRN: [id number] Date of Birth: [birth year]

## 2019-03-07 ENCOUNTER — Ambulatory Visit (INDEPENDENT_AMBULATORY_CARE_PROVIDER_SITE_OTHER): Payer: Managed Care, Other (non HMO) | Admitting: Physical Therapy

## 2019-03-07 DIAGNOSIS — M6281 Muscle weakness (generalized): Secondary | ICD-10-CM | POA: Diagnosis not present

## 2019-03-07 DIAGNOSIS — M5416 Radiculopathy, lumbar region: Secondary | ICD-10-CM

## 2019-03-07 NOTE — Therapy (Signed)
[facility] [place], [place], [address] Phone: [phone]   Fax:  [phone]  Physical Therapy Treatment  Patient Details  Name: Marissa Chaney MRN: [id number] Date of Birth: [birth year] Referring Provider (PT): Dr. Dianah Field   Encounter Date: 03/07/2019  PT End of Session - 03/07/19 1614    Visit Number  14    Number of Visits  20    Date for PT Re-Evaluation  03/27/19    PT Start Time  6629    PT Stop Time  1618    PT Time Calculation (min)  45 min    Activity Tolerance  Patient tolerated treatment well;No increased pain    Behavior During Therapy  WFL for tasks assessed/performed       Past Medical History:  Diagnosis Date  . Cervical polyp 05/31/2018  . Hypertension   . Iron deficiency anemia 04/29/2018  . Sarcoidosis     Past Surgical History:  Procedure Laterality Date  . NO PAST SURGERIES    . VAGINAL HYSTERECTOMY Bilateral 12/10/2018   Procedure: HYSTERECTOMY VAGINAL WITH SALPINGECTOMY;  Surgeon: Emily Filbert, MD;  Location: [place];  Service: Gynecology;  Laterality: Bilateral;    There were no vitals filed for this visit.  Subjective Assessment - 03/07/19 1542    Subjective  Pt woke up with 5/10 pain in LLE.  Had a busy day, "had to move more quickly than I had been". Her back started spasming and the nerve in her lower left leg continued to nag.    Diagnostic tests  MRI - disc bulge L4/5, L5/S1    Patient Stated Goals  to get rid of pain and increase strength    Currently in Pain?  Yes    Pain Score  4     Pain Location  Leg    Pain Descriptors / Indicators  Nagging         [facility] PT Assessment - 03/07/19 0001      Assessment   Medical Diagnosis  left lumbar radiculopathy    Referring Provider (PT)  Dr. Dianah Field    Onset Date/Surgical Date  09/18/18    Next MD Visit  03/28/19    Prior Therapy  no       [facility] Adult PT Treatment/Exercise - 03/07/19 0001      Lumbar  Exercises: Stretches   Passive Hamstring Stretch  Left;2 reps;20 seconds    Standing Extension  1 rep;5 seconds    Prone on Elbows Stretch  1 rep;20 seconds    Press Ups  5 reps;5 seconds    Piriformis Stretch  2 reps;Left;Right;1 rep;20 seconds    Gastroc Stretch  2 reps;20 seconds   incline board, both feet     Lumbar Exercises: Aerobic   Tread Mill  5 min up to 2.8 mph for warm up.       Lumbar Exercises: Supine   Straight Leg Raise  10 reps   core engaged.      Lumbar Exercises: Prone   Opposite Arm/Leg Raise  Right arm/Left leg;Left arm/Right leg;10 reps;3 seconds    Other Prone Lumbar Exercises  prone pelvic press x 5 sec hold x 5 reps, then with unliateral knee flexion x 5 reps each side.       Lumbar Exercises: Quadruped   Madcat/Old Horse  5 reps   5 sec hold     Knee/Hip Exercises: Stretches   Passive Hamstring Stretch  Right;Left;2 reps;30 seconds  supine with strap     Moist Heat Therapy   Number Minutes Moist Heat  10 Minutes    Moist Heat Location  Lumbar Spine      Electrical Stimulation   Electrical Stimulation Location  bilat rhomboid, Lt SI/ glute     Electrical Stimulation Action  premod to each area    Electrical Stimulation Parameters  x 10 min, intensity to tolerance    Electrical Stimulation Goals  Pain             PT Education - 03/07/19 1559    Education Details  discussed avoiding trunk rotation (stretches and strengthening) for now.    Person(s) Educated  Patient    Methods  Explanation    Comprehension  Verbalized understanding          PT Long Term Goals - 03/06/19 1606      PT LONG TERM GOAL #1   Title  Ind with HEP to improve strength and flexibility    Time  6    Period  Weeks    Status  Partially Met      PT LONG TERM GOAL #2   Title  Patient to report no pain in LLE with ADLS    Baseline  after 4-5 hours of standing at work she begins to feel into Lt leg.    Time  6    Period  Weeks    Status  On-going      PT  LONG TERM GOAL #3   Title  Patient to demo 5/5 LLE strength to normalize ADLS    Time  6    Status  Partially Met      PT LONG TERM GOAL #4   Title  Patient able to perform ADLS including sitting with low back pain 2/10 or less.    Baseline  able to sit, but reports needing to reposition frequently    Time  6    Period  Weeks    Status  On-going      PT LONG TERM GOAL #5   Title  Patient able to ambulate without gait deviations    Baseline  small Rt lat lean and some intermittent scissoring with RLE    Time  6    Period  Weeks    Status  On-going            Plan - 03/07/19 1548    Clinical Impression Statement  Pt presents with increased radicular symptoms.  Focused exercise on ext and no rotation. With just performing extension based exercise, pain in lower L leg abolished.  No new goals met this date.    Personal Factors and Comorbidities  Comorbidity 3+    Comorbidities  sarcoidosis, bulging discs, recent hysterectomy    Examination-Activity Limitations  Bend;Lift;Sit    Stability/Clinical Decision Making  Stable/Uncomplicated    Rehab Potential  Excellent    PT Frequency  2x / week    PT Duration  4 weeks    PT Treatment/Interventions  ADLs/Self Care Home Management;Electrical Stimulation;Cryotherapy;Moist Heat;Traction;Ultrasound;Therapeutic activities;Therapeutic exercise;Balance training;Neuromuscular re-education;Patient/family education;Dry needling;Manual techniques;Taping    PT Next Visit Plan  continue LE, core and lumbar strengthening. DN and manual therapy as indicated.    PT Home Exercise Plan  [id number]; pelvic press series, cat/camel stretching    Consulted and Agree with Plan of Care  Patient       Patient will benefit from skilled therapeutic intervention in order to improve the following deficits and impairments:  Pain, Decreased activity tolerance, Impaired flexibility, Decreased balance, Decreased strength, Postural dysfunction  Visit  Diagnosis: Radiculopathy, lumbar region  Muscle weakness (generalized)     Problem List Patient Active Problem List   Diagnosis Date Noted  . Post-operative state 12/10/2018  . Cervical polyp 05/31/2018  . Uterine leiomyoma 05/31/2018  . Myofascial pain 05/31/2018  . Lichen simplex chronicus 05/01/2018  . Chronic fatigue 05/01/2018  . Hypercalcemia 04/29/2018  . Iron deficiency anemia 04/29/2018  . Normocytic anemia 04/26/2018  . Vitamin D deficiency 04/26/2018  . Left lumbar radiculopathy 04/26/2018  . Intermenstrual bleeding 04/26/2018  . Hypertension goal BP (blood pressure) < 130/80 04/26/2018  . Tinea pedis of both feet 07/22/2015  . Sarcoidosis 09/09/2014  . Uveitis 06/03/2014   Kerin Perna, PTA 03/07/19 4:31 PM  [facility] [place], [place], [address] Phone: [phone]   Fax:  [phone]  Name: Marissa Chaney MRN: [id number] Date of Birth: 13-Feb-1976

## 2019-03-08 ENCOUNTER — Other Ambulatory Visit: Payer: Self-pay | Admitting: Physician Assistant

## 2019-03-08 DIAGNOSIS — I10 Essential (primary) hypertension: Secondary | ICD-10-CM

## 2019-03-12 ENCOUNTER — Encounter: Payer: Self-pay | Admitting: Physical Therapy

## 2019-03-12 ENCOUNTER — Ambulatory Visit (INDEPENDENT_AMBULATORY_CARE_PROVIDER_SITE_OTHER): Payer: Managed Care, Other (non HMO) | Admitting: Physical Therapy

## 2019-03-12 ENCOUNTER — Other Ambulatory Visit: Payer: Self-pay

## 2019-03-12 DIAGNOSIS — M6281 Muscle weakness (generalized): Secondary | ICD-10-CM | POA: Diagnosis not present

## 2019-03-12 DIAGNOSIS — M5416 Radiculopathy, lumbar region: Secondary | ICD-10-CM

## 2019-03-12 DIAGNOSIS — R2681 Unsteadiness on feet: Secondary | ICD-10-CM | POA: Diagnosis not present

## 2019-03-12 NOTE — Therapy (Signed)
[facility] [place], [place], [address] Phone: [phone]   Fax:  [phone]  Physical Therapy Treatment  Patient Details  Name: Marissa Chaney MRN: [id number] Date of Birth: April 03, 1975 Referring Provider (PT): Dr. Dianah Field   Encounter Date: 03/12/2019  PT End of Session - 03/12/19 1515    Visit Number  15    Number of Visits  20    Date for PT Re-Evaluation  03/27/19    PT Start Time  1508    PT Stop Time  1558    PT Time Calculation (min)  50 min    Activity Tolerance  Patient tolerated treatment well;No increased pain    Behavior During Therapy  WFL for tasks assessed/performed       Past Medical History:  Diagnosis Date  . Cervical polyp 05/31/2018  . Hypertension   . Iron deficiency anemia 04/29/2018  . Sarcoidosis     Past Surgical History:  Procedure Laterality Date  . NO PAST SURGERIES    . VAGINAL HYSTERECTOMY Bilateral 12/10/2018   Procedure: HYSTERECTOMY VAGINAL WITH SALPINGECTOMY;  Surgeon: Emily Filbert, MD;  Location: [place];  Service: Gynecology;  Laterality: Bilateral;    There were no vitals filed for this visit.  Subjective Assessment - 03/12/19 1516    Subjective  Pt reports she doesn't have a chair at work yet; she can stand ~4-5 hr before pain in back and leg increase.  She has been stretching each morning, and this has helped.    Pertinent History  sarcoidosis, bulging discs L4/5 and L5/S1, hysterectomy 12/10/18    Patient Stated Goals  to get rid of pain and increase strength    Currently in Pain?  Yes    Pain Score  4     Pain Location  Back    Pain Orientation  Left;Lower    Pain Descriptors / Indicators  Aching    Pain Radiating Towards  LLE to calf    Aggravating Factors   reaching forward, sitting at desk    Pain Relieving Factors  estim, OTC meds, stretches         [facility] PT Assessment - 03/12/19 0001      Assessment   Medical Diagnosis  left lumbar  radiculopathy    Referring Provider (PT)  Dr. Dianah Field    Onset Date/Surgical Date  09/18/18    Next MD Visit  03/28/19    Prior Therapy  no        [facility] Adult PT Treatment/Exercise - 03/12/19 0001      Self-Care   Other Self-Care Comments   pt educated on self massage with tennis ball to Lt hamstring (pin and stretch) with active knee flex/ext; pt returned demo with cues.       Lumbar Exercises: Stretches   Press Ups  5 reps;5 seconds    Piriformis Stretch  2 reps;Left;Right;20 seconds      Lumbar Exercises: Aerobic   Tread Mill  6 min up to 2.8 mph for warm up; cues for shorten stride, even step length.       Lumbar Exercises: Standing   Other Standing Lumbar Exercises  anti-rotation walk outs with red band. x 8 reps each side - multiple cues for technique    Other Standing Lumbar Exercises  founder position with dowel against back x  15      Lumbar Exercises: Prone   Opposite Arm/Leg Raise  Right arm/Left leg;Left arm/Right leg;10 reps;3 seconds  with axial ext   Other Prone Lumbar Exercises  prone pelvic press with unliateral knee flexion x 10 reps each side.       Lumbar Exercises: Quadruped   Madcat/Old Horse  5 reps   5 sec hold     Knee/Hip Exercises: Stretches   Passive Hamstring Stretch  Left;30 seconds;3 reps;Right;2 reps   supine with strap, opp knee bent   Gastroc Stretch  Both;3 reps;20 seconds   incline board     Moist Heat Therapy   Number Minutes Moist Heat  12 Minutes    Moist Heat Location  Lumbar Spine      Electrical Stimulation   Electrical Stimulation Location  bilat lumbar paraspinals    Electrical Stimulation Action  IFC    Electrical Stimulation Parameters  x 10 min, intensity to tolerance    Electrical Stimulation Goals  Pain      Manual Therapy   Soft tissue mobilization  to gluteals, piriformis, and QL left, with and without active IR/ER of LLE.                   PT Long Term Goals - 03/06/19 1606      PT LONG TERM  GOAL #1   Title  Ind with HEP to improve strength and flexibility    Time  6    Period  Weeks    Status  Partially Met      PT LONG TERM GOAL #2   Title  Patient to report no pain in LLE with ADLS    Baseline  after 4-5 hours of standing at work she begins to feel into Lt leg.    Time  6    Period  Weeks    Status  On-going      PT LONG TERM GOAL #3   Title  Patient to demo 5/5 LLE strength to normalize ADLS    Time  6    Status  Partially Met      PT LONG TERM GOAL #4   Title  Patient able to perform ADLS including sitting with low back pain 2/10 or less.    Baseline  able to sit, but reports needing to reposition frequently    Time  6    Period  Weeks    Status  On-going      PT LONG TERM GOAL #5   Title  Patient able to ambulate without gait deviations    Baseline  small Rt lat lean and some intermittent scissoring with RLE    Time  6    Period  Weeks    Status  On-going            Plan - 03/12/19 1554    Clinical Impression Statement  Pt tolerated all exercises well, reporting decrease in symptoms during session.  Further reduction of pain in back and LE with estim/MHP at end of session.  Good response to treatment. Improved standing and sitting tolerance prior to LE pain (LTG#2, 4)    Personal Factors and Comorbidities  Comorbidity 3+    Comorbidities  sarcoidosis, bulging discs, recent hysterectomy    Examination-Activity Limitations  Bend;Lift;Sit    Stability/Clinical Decision Making  Stable/Uncomplicated    Rehab Potential  Excellent    PT Frequency  2x / week    PT Duration  4 weeks    PT Treatment/Interventions  ADLs/Self Care Home Management;Electrical Stimulation;Cryotherapy;Moist Heat;Traction;Ultrasound;Therapeutic activities;Therapeutic exercise;Balance training;Neuromuscular re-education;Patient/family education;Dry needling;Manual techniques;Taping    PT Next  Visit Plan  continue LE, core and lumbar strengthening. DN and manual therapy as indicated.     PT Home Exercise Plan  [id number]; pelvic press series, cat/camel stretching    Consulted and Agree with Plan of Care  Patient       Patient will benefit from skilled therapeutic intervention in order to improve the following deficits and impairments:  Pain, Decreased activity tolerance, Impaired flexibility, Decreased balance, Decreased strength, Postural dysfunction  Visit Diagnosis: Radiculopathy, lumbar region  Muscle weakness (generalized)  Unsteadiness on feet     Problem List Patient Active Problem List   Diagnosis Date Noted  . Post-operative state 12/10/2018  . Cervical polyp 05/31/2018  . Uterine leiomyoma 05/31/2018  . Myofascial pain 05/31/2018  . Lichen simplex chronicus 05/01/2018  . Chronic fatigue 05/01/2018  . Hypercalcemia 04/29/2018  . Iron deficiency anemia 04/29/2018  . Normocytic anemia 04/26/2018  . Vitamin D deficiency 04/26/2018  . Left lumbar radiculopathy 04/26/2018  . Intermenstrual bleeding 04/26/2018  . Hypertension goal BP (blood pressure) < 130/80 04/26/2018  . Tinea pedis of both feet 07/22/2015  . Sarcoidosis 09/09/2014  . Uveitis 06/03/2014   Kerin Perna, PTA 03/12/19 4:04 PM  [facility] [place], [place], [address] Phone: [phone]   Fax:  [phone]  Name: REGNIA MATHWIG MRN: [id number] Date of Birth: May 13, 1975

## 2019-03-17 ENCOUNTER — Telehealth (INDEPENDENT_AMBULATORY_CARE_PROVIDER_SITE_OTHER): Payer: Managed Care, Other (non HMO) | Admitting: Osteopathic Medicine

## 2019-03-17 ENCOUNTER — Encounter: Payer: Self-pay | Admitting: Osteopathic Medicine

## 2019-03-17 VITALS — Temp 96.7°F | Wt 172.0 lb

## 2019-03-17 DIAGNOSIS — I10 Essential (primary) hypertension: Secondary | ICD-10-CM | POA: Diagnosis not present

## 2019-03-17 DIAGNOSIS — D649 Anemia, unspecified: Secondary | ICD-10-CM | POA: Diagnosis not present

## 2019-03-17 DIAGNOSIS — E559 Vitamin D deficiency, unspecified: Secondary | ICD-10-CM | POA: Diagnosis not present

## 2019-03-17 DIAGNOSIS — D509 Iron deficiency anemia, unspecified: Secondary | ICD-10-CM

## 2019-03-17 MED ORDER — FLUCONAZOLE 150 MG PO TABS: 150.0000 mg | ORAL_TABLET | Freq: Once | ORAL | 1 refills | Status: AC

## 2019-03-17 MED ORDER — AMLODIPINE BESYLATE 10 MG PO TABS: 10.0000 mg | ORAL_TABLET | Freq: Every day | ORAL | 3 refills | Status: DC

## 2019-03-17 NOTE — Progress Notes (Signed)
Virtual Visit via Video (App used: Doximity) Note  I connected with      Marissa Chaney on 03/17/19 at 3:48 PM  by a telemedicine application and verified that I am speaking with the correct person using two identifiers.  Patient is at home I am in office   I discussed the limitations of evaluation and management by telemedicine and the availability of in person appointments. The patient expressed understanding and agreed to proceed.  History of Present Illness: Marissa Chaney is a 43 y.o. female who would like to discuss refill medications for HTN   BP medication needs refilled, no chest pain, pressure, shortness of breath, dizziness or lightheadedness.  Patient is due for labs.  BP Readings from Last 3 Encounters:  02/28/19 122/83  02/07/19 108/75  01/22/19 112/71    Concerned about possible vaginal yeast infection, requests medication.     Observations/Objective: Temp (!) 96.7 F (35.9 C) (Temporal)   Wt 172 lb (78 kg)   LMP 11/19/2018   BMI 28.62 kg/m  BP Readings from Last 3 Encounters:  02/28/19 122/83  02/07/19 108/75  01/22/19 112/71   Exam: Normal Speech.  NAD  Lab and Radiology Results No results found for this or any previous visit (from the past 72 hour(s)). No results found.     Assessment and Plan: 43 y.o. female with The primary encounter diagnosis was Vitamin D deficiency. Diagnoses of Essential hypertension, Normocytic anemia, Iron deficiency anemia, unspecified iron deficiency anemia type, and Hypercalcemia were also pertinent to this visit.   PDMP not reviewed this encounter. Orders Placed This Encounter  Procedures  . CBC  . COMPLETE METABOLIC PANEL WITH GFR  . LIPID SCREENING  . Fe+TIBC+Fer  . VITAMIN D 25   Meds ordered this encounter  Medications  . amLODipine (NORVASC) 10 MG tablet    Sig: Take 1 tablet (10 mg total) by mouth daily.    Dispense:  90 tablet    Refill:  3  . fluconazole (DIFLUCAN) 150 MG tablet    Sig:  Take 1 tablet (150 mg total) by mouth once for 1 dose. Repeat dose 72 hours if yeast infection persists    Dispense:  2 tablet    Refill:  1     Instructions sent via MyChart. If MyChart not available, pt was given option for info via personal e-mail w/ no guarantee of protected health info over unsecured e-mail communication, and MyChart sign-up instructions were sent to patient.   Follow Up Instructions: Return in about 1 year (around 03/16/2020) for Annual checkup as long as labs look good..    I discussed the assessment and treatment plan with the patient. The patient was provided an opportunity to ask questions and all were answered. The patient agreed with the plan and demonstrated an understanding of the instructions.   The patient was advised to call back or seek an in-person evaluation if any new concerns, if symptoms worsen or if the condition fails to improve as anticipated.  15 minutes of non-face-to-face time was provided during this encounter.      . . . . . . . . . . . . . Marland Kitchen                   Historical information moved to improve visibility of documentation.  Past Medical History:  Diagnosis Date  . Cervical polyp 05/31/2018  . Hypertension   . Iron deficiency anemia 04/29/2018  . Sarcoidosis  Past Surgical History:  Procedure Laterality Date  . NO PAST SURGERIES    . VAGINAL HYSTERECTOMY Bilateral 12/10/2018   Procedure: HYSTERECTOMY VAGINAL WITH SALPINGECTOMY;  Surgeon: Emily Filbert, MD;  Location: [facility];  Service: Gynecology;  Laterality: Bilateral;   Social History   Tobacco Use  . Smoking status: Current Some Day Smoker    Types: Cigarettes  . Smokeless tobacco: Never Used  . Tobacco comment: 1-2 cig/day  Substance Use Topics  . Alcohol use: Yes    Alcohol/week: 1.0 - 2.0 standard drinks    Types: 1 Glasses of wine per week   family history includes Hypertension in her mother.  Medications: Current Outpatient  Medications  Medication Sig Dispense Refill  . amLODipine (NORVASC) 10 MG tablet TAKE 1 TABLET BY MOUTH EVERY DAY 30 tablet 0  . gabapentin (NEURONTIN) 300 MG capsule One tab PO qHS for a week, then BID for a week, then TID. May double weekly to a max of 3,600mg /day 90 capsule 3  . ibuprofen (ADVIL) 600 MG tablet Take 1 tablet (600 mg total) by mouth every 6 (six) hours as needed. 30 tablet 1  . Vitamin D, Ergocalciferol, (DRISDOL) 1.25 MG (50000 UT) CAPS capsule Take 1 capsule (50,000 Units total) by mouth every 7 (seven) days. 16 capsule 0   No current facility-administered medications for this visit.   Allergies  Allergen Reactions  . Blueberry [Vaccinium Angustifolium] Nausea And Vomiting

## 2019-03-20 ENCOUNTER — Ambulatory Visit (INDEPENDENT_AMBULATORY_CARE_PROVIDER_SITE_OTHER): Payer: Managed Care, Other (non HMO) | Admitting: Sports Medicine

## 2019-03-20 ENCOUNTER — Encounter: Payer: 59 | Admitting: Physical Therapy

## 2019-03-20 ENCOUNTER — Encounter: Payer: Self-pay | Admitting: Sports Medicine

## 2019-03-20 ENCOUNTER — Other Ambulatory Visit: Payer: Self-pay

## 2019-03-20 ENCOUNTER — Ambulatory Visit (INDEPENDENT_AMBULATORY_CARE_PROVIDER_SITE_OTHER): Payer: Managed Care, Other (non HMO) | Admitting: Physical Therapy

## 2019-03-20 DIAGNOSIS — M5416 Radiculopathy, lumbar region: Secondary | ICD-10-CM

## 2019-03-20 DIAGNOSIS — M6281 Muscle weakness (generalized): Secondary | ICD-10-CM | POA: Diagnosis not present

## 2019-03-20 NOTE — Therapy (Addendum)
[place], [place], [address] Phone: [phone]   Fax:  [phone]  Physical Therapy Treatment and Discharge Summary  Patient Details  Name: Marissa Chaney MRN: [id number] Date of Birth: [birth year] Referring Provider (PT): Dr. Dianah Field   Encounter Date: 03/20/2019  PT End of Session - 03/20/19 1203    Visit Number  16    Number of Visits  20    Date for PT Re-Evaluation  03/27/19    PT Start Time  1147    PT Stop Time  1236    PT Time Calculation (min)  49 min    Activity Tolerance  Patient tolerated treatment well    Behavior During Therapy  [facility] for tasks assessed/performed       Past Medical History:  Diagnosis Date  . Cervical polyp 05/31/2018  . Hypertension   . Iron deficiency anemia 04/29/2018  . Sarcoidosis     Past Surgical History:  Procedure Laterality Date  . NO PAST SURGERIES    . VAGINAL HYSTERECTOMY Bilateral 12/10/2018   Procedure: HYSTERECTOMY VAGINAL WITH SALPINGECTOMY;  Surgeon: Emily Filbert, MD;  Location: [place];  Service: Gynecology;  Laterality: Bilateral;    There were no vitals filed for this visit.  Subjective Assessment - 03/20/19 1204    Subjective  Pt reports she had a flare up last weekend. She thinks she may have moved quickly at work, and everything tensed up.    Diagnostic tests  MRI - disc bulge L4/5, L5/S1    Patient Stated Goals  to get rid of pain and increase strength    Currently in Pain?  Yes    Pain Score  5    took meds 1 hr prior to session   Pain Location  Back    Pain Orientation  Left    Pain Descriptors / Indicators  Aching;Nagging    Pain Radiating Towards  into buttock and Lt lateral calf    Aggravating Factors   reaching forward, sitting at desk    Pain Relieving Factors  estim, OTC meds, stretches         [facility] PT Assessment - 03/20/19 0001      Assessment   Medical Diagnosis  left lumbar radiculopathy    Referring  Provider (PT)  Dr. Dianah Field    Onset Date/Surgical Date  09/18/18    Prior Therapy  no          [facility] Adult PT Treatment/Exercise - 03/20/19 0001      Lumbar Exercises: Stretches   Passive Hamstring Stretch  Right;Left;3 reps;20 seconds   supine with strap   Press Ups  10 reps;5 seconds    Quad Stretch  3 reps;20 seconds    Piriformis Stretch  2 reps;Left;Right;20 seconds   incline board      Lumbar Exercises: Aerobic   Tread Mill  6 min up to 2.8 mph for warm up; cues for shorten stride, even step length.    PTA present to discuss progress and monitor     Lumbar Exercises: Seated   Other Seated Lumbar Exercises  core engaged with alternating shoulder flex to 90 deg x 5 each arm      Lumbar Exercises: Prone   Opposite Arm/Leg Raise  Right arm/Left leg;Left arm/Right leg;10 reps;3 seconds   with axial ext   Opposite Arm/Leg Raise Limitations  2nd set with 5 reps each side.       Moist Heat Therapy  Number Minutes Moist Heat  10 Minutes    Moist Heat Location  Lumbar Spine      Electrical Stimulation   Electrical Stimulation Location  bilat lumbar paraspinals    Electrical Stimulation Action  IFC    Electrical Stimulation Parameters  x 10 min, intensity to tolerance     Electrical Stimulation Goals  Pain      Manual Therapy   Soft tissue mobilization  deep tissue to Lt glut max and med, piriformis and deep hip rotators, and Lt hamstring, with and without active IR/ER of LLE.         PT Long Term Goals - 03/06/19 1606      PT LONG TERM GOAL #1   Title  Ind with HEP to improve strength and flexibility    Time  6    Period  Weeks    Status  Partially Met      PT LONG TERM GOAL #2   Title  Patient to report no pain in LLE with ADLS    Baseline  after 4-5 hours of standing at work she begins to feel into Lt leg.    Time  6    Period  Weeks    Status  On-going      PT LONG TERM GOAL #3   Title  Patient to demo 5/5 LLE strength to normalize ADLS    Time  6     Status  Partially Met      PT LONG TERM GOAL #4   Title  Patient able to perform ADLS including sitting with low back pain 2/10 or less.    Baseline  able to sit, but reports needing to reposition frequently    Time  6    Period  Weeks    Status  On-going      PT LONG TERM GOAL #5   Title  Patient able to ambulate without gait deviations    Baseline  small Rt lat lean and some intermittent scissoring with RLE    Time  6    Period  Weeks    Status  On-going            Plan - 03/20/19 1247    Clinical Impression Statement  Pt with recent flare of symptoms with bending and twisting at work.  Pt reported slight reduction of pain with stretches and ext based strengthening; and significant reduction in radicular symptoms with manual therapy.  Reviewed verbally importance of good body mechanics at work/ home. No new goals met since recent flare up. Pt is scheduled to get epidural next week and her plan of care is also ending; will need to assess need for additional sessions vs hold vs d/c.    Personal Factors and Comorbidities  Comorbidity 3+    Comorbidities  sarcoidosis, bulging discs, recent hysterectomy    Examination-Activity Limitations  Bend;Lift;Sit    Stability/Clinical Decision Making  Stable/Uncomplicated    Rehab Potential  Excellent    PT Frequency  2x / week    PT Duration  4 weeks    PT Treatment/Interventions  ADLs/Self Care Home Management;Electrical Stimulation;Cryotherapy;Moist Heat;Traction;Ultrasound;Therapeutic activities;Therapeutic exercise;Balance training;Neuromuscular re-education;Patient/family education;Dry needling;Manual techniques;Taping    PT Next Visit Plan  DN and manual therapy as indicated.  functional core strengthening.  assess goals, progress, and need for additional sessions.    PT Home Exercise Plan  [id number]; pelvic press series, cat/camel stretching    Consulted and Agree with Plan of Care  Patient  Patient will benefit from skilled  therapeutic intervention in order to improve the following deficits and impairments:  Pain, Decreased activity tolerance, Impaired flexibility, Decreased balance, Decreased strength, Postural dysfunction  Visit Diagnosis: Radiculopathy, lumbar region  Muscle weakness (generalized)     Problem List Patient Active Problem List   Diagnosis Date Noted  . Post-operative state 12/10/2018  . Cervical polyp 05/31/2018  . Uterine leiomyoma 05/31/2018  . Myofascial pain 05/31/2018  . Lichen simplex chronicus 05/01/2018  . Chronic fatigue 05/01/2018  . Hypercalcemia 04/29/2018  . Iron deficiency anemia 04/29/2018  . Normocytic anemia 04/26/2018  . Vitamin D deficiency 04/26/2018  . Left lumbar radiculopathy 04/26/2018  . Intermenstrual bleeding 04/26/2018  . Hypertension goal BP (blood pressure) < 130/80 04/26/2018  . Tinea pedis of both feet 07/22/2015  . Sarcoidosis 09/09/2014  . Uveitis 06/03/2014   Kerin Perna, PTA 03/20/19 12:56 PM  [place], [place], [address] Phone: [phone]   Fax:  [phone]  Name: JANEAL ABADI MRN: [id number] Date of Birth: [birth year]  PHYSICAL THERAPY DISCHARGE SUMMARY  Visits from Start of Care: 16  Current functional level related to goals / functional outcomes: See above   Remaining deficits: See above   Education / Equipment: HEP Plan: Patient agrees to discharge.  Patient goals were partially met. Patient is being discharged due to not returning since the last visit.  ?????Patient was scheduled to have an epidural and did not return to Kathleen, PT 04/23/19 9:15 AM;  [facility] [place], [place] [address]  [phone] (office) [phone] (fax)

## 2019-03-20 NOTE — Progress Notes (Signed)
Subjective:    CC: Follow-up  HPI: Lumbar radiculitis: Persistent back pain, in spite of conservative measures, radiating down the left side of the back, buttock, to the anterolateral lower leg but not quite to the toes.  I reviewed the past medical history, family history, social history, surgical history, and allergies today and no changes were needed.  Please see the problem list section below in epic for further details.  Past Medical History: Past Medical History:  Diagnosis Date  . Cervical polyp 05/31/2018  . Hypertension   . Iron deficiency anemia 04/29/2018  . Sarcoidosis    Past Surgical History: Past Surgical History:  Procedure Laterality Date  . NO PAST SURGERIES    . VAGINAL HYSTERECTOMY Bilateral 12/10/2018   Procedure: HYSTERECTOMY VAGINAL WITH SALPINGECTOMY;  Surgeon: Emily Filbert, MD;  Location: [place];  Service: Gynecology;  Laterality: Bilateral;   Social History: Social History   Socioeconomic History  . Marital status: Married    Spouse name: Not on file  . Number of children: Not on file  . Years of education: Not on file  . Highest education level: Not on file  Occupational History  . Not on file  Tobacco Use  . Smoking status: Current Some Day Smoker    Types: Cigarettes  . Smokeless tobacco: Never Used  . Tobacco comment: 1-2 cig/day  Substance and Sexual Activity  . Alcohol use: Yes    Alcohol/week: 1.0 - 2.0 standard drinks    Types: 1 Glasses of wine per week  . Drug use: Never  . Sexual activity: Yes    Birth control/protection: None  Other Topics Concern  . Not on file  Social History Narrative  . Not on file   Social Determinants of Health   Financial Resource Strain: Low Risk   . Difficulty of Paying Living Expenses: Not hard at all  Food Insecurity: No Food Insecurity  . Worried About Charity fundraiser in the Last Year: Never true  . Ran Out of Food in the Last Year: Never true  Transportation Needs: No Transportation Needs   . Lack of Transportation (Medical): No  . Lack of Transportation (Non-Medical): No  Physical Activity: Inactive  . Days of Exercise per Week: 0 days  . Minutes of Exercise per Session: 0 min  Stress: No Stress Concern Present  . Feeling of Stress : Not at all  Social Connections: Somewhat Isolated  . Frequency of Communication with Friends and Family: More than three times a week  . Frequency of Social Gatherings with Friends and Family: More than three times a week  . Attends Religious Services: Never  . Active Member of Clubs or Organizations: No  . Attends Archivist Meetings: Not asked  . Marital Status: Married   Family History: Family History  Problem Relation Age of Onset  . Hypertension Mother    Allergies: Allergies  Allergen Reactions  . Blueberry [Vaccinium Angustifolium] Nausea And Vomiting   Medications: See med rec.  Review of Systems: No fevers, chills, night sweats, weight loss, chest pain, or shortness of breath.   Objective:    General: Well Developed, well nourished, and in no acute distress.  Neuro: Alert and oriented x3, extra-ocular muscles intact, sensation grossly intact.  HEENT: Normocephalic, atraumatic, pupils equal round reactive to light, neck supple, no masses, no lymphadenopathy, thyroid nonpalpable.  Skin: Warm and dry, no rashes. Cardiac: Regular rate and rhythm, no murmurs rubs or gallops, no lower extremity edema.  Respiratory: Clear  to auscultation bilaterally. Not using accessory muscles, speaking in full sentences.  Impression and Recommendations:    Left lumbar radiculopathy Additional FMLA paperwork filled out today. Pain is axial, with a left-sided L5 radicular component, at this point we are going to proceed with a left L5-S1 interlaminar epidural. Return to see me 1 month after injection to evaluate relief.   ___________________________________________ Gwen Her. Dianah Field, M.D., ABFM., CAQSM. Primary Care and  Sports Medicine [facility]  Adjunct Professor of [place] of [employer] of Medicine

## 2019-03-20 NOTE — Assessment & Plan Note (Signed)
Additional FMLA paperwork filled out today. Pain is axial, with a left-sided L5 radicular component, at this point we are going to proceed with a left L5-S1 interlaminar epidural. Return to see me 1 month after injection to evaluate relief.

## 2019-03-25 ENCOUNTER — Ambulatory Visit
Admission: RE | Admit: 2019-03-25 | Discharge: 2019-03-25 | Disposition: A | Discharge status: Home / Self Care | Payer: Managed Care, Other (non HMO) | Source: Ambulatory Visit | Attending: Sports Medicine | Admitting: Sports Medicine

## 2019-03-25 DIAGNOSIS — M5416 Radiculopathy, lumbar region: Secondary | ICD-10-CM

## 2019-03-25 MED ORDER — IOPAMIDOL (ISOVUE-M 200) INJECTION 41%
1.0000 mL | Freq: Once | INTRAMUSCULAR | Status: AC
  Administered 2019-03-25: 14:00:00 1 mL via EPIDURAL

## 2019-03-25 MED ORDER — METHYLPREDNISOLONE ACETATE 40 MG/ML INJ SUSP (RADIOLOG
120.0000 mg | Freq: Once | INTRAMUSCULAR | Status: AC
  Administered 2019-03-25: 120 mg via EPIDURAL

## 2019-03-25 NOTE — Discharge Instructions (Signed)

## 2019-03-27 ENCOUNTER — Other Ambulatory Visit: Payer: Self-pay | Admitting: Physician Assistant

## 2019-03-27 DIAGNOSIS — L280 Lichen simplex chronicus: Secondary | ICD-10-CM

## 2019-03-27 NOTE — Telephone Encounter (Signed)
This was discontinued.  Please advise.

## 2019-03-28 ENCOUNTER — Ambulatory Visit: Payer: 59 | Admitting: Sports Medicine

## 2019-03-29 LAB — COMPLETE METABOLIC PANEL WITH GFR
AG Ratio: 1.6 (calc) (ref 1.0–2.5)
ALT: 8 U/L (ref 6–29)
AST: 14 U/L (ref 10–30)
Albumin: 4.4 g/dL (ref 3.6–5.1)
Alkaline phosphatase (APISO): 91 U/L (ref 31–125)
BUN: 7 mg/dL (ref 7–25)
CO2: 29 mmol/L (ref 20–32)
Calcium: 10.5 mg/dL — ABNORMAL HIGH (ref 8.6–10.2)
Chloride: 105 mmol/L (ref 98–110)
Creat: 0.72 mg/dL (ref 0.50–1.10)
GFR, Est African American: 120 mL/min/{1.73_m2} (ref >=60)
GFR, Est Non African American: 103 mL/min/{1.73_m2} (ref >=60)
Globulin: 2.7 g/dL (calc) (ref 1.9–3.7)
Glucose, Bld: 88 mg/dL (ref 65–99)
Potassium: 4.2 mmol/L (ref 3.5–5.3)
Sodium: 141 mmol/L (ref 135–146)
Total Bilirubin: 0.4 mg/dL (ref 0.2–1.2)
Total Protein: 7.1 g/dL (ref 6.1–8.1)

## 2019-03-29 LAB — CBC
HCT: 39.6 % (ref 35.0–45.0)
Hemoglobin: 13.1 g/dL (ref 11.7–15.5)
MCH: 30.9 pg (ref 27.0–33.0)
MCHC: 33.1 g/dL (ref 32.0–36.0)
MCV: 93.4 fL (ref 80.0–100.0)
MPV: 10.3 fL (ref 7.5–12.5)
Platelets: 359 10*3/uL (ref 140–400)
RBC: 4.24 10*6/uL (ref 3.80–5.10)
RDW: 13 % (ref 11.0–15.0)
WBC: 5.9 10*3/uL (ref 3.8–10.8)

## 2019-03-29 LAB — IRON,TIBC AND FERRITIN PANEL
%SAT: 21 % (calc) (ref 16–45)
Ferritin: 14 ng/mL — ABNORMAL LOW (ref 16–232)
Iron: 91 ug/dL (ref 40–190)
TIBC: 429 mcg/dL (calc) (ref 250–450)

## 2019-03-29 LAB — LIPID PANEL
Cholesterol: 226 mg/dL — ABNORMAL HIGH (ref <200)
HDL: 69 mg/dL (ref >=50)
LDL Cholesterol (Calc): 142 mg/dL (calc) — ABNORMAL HIGH
Non-HDL Cholesterol (Calc): 157 mg/dL (calc) — ABNORMAL HIGH (ref <130)
Total CHOL/HDL Ratio: 3.3 (calc) (ref <5.0)
Triglycerides: 49 mg/dL (ref <150)

## 2019-03-29 LAB — VITAMIN D 25 HYDROXY (VIT D DEFICIENCY, FRACTURES): Vit D, 25-Hydroxy: 27 ng/mL — ABNORMAL LOW (ref 30–100)

## 2019-03-31 ENCOUNTER — Encounter: Payer: Managed Care, Other (non HMO) | Admitting: Physical Therapy

## 2019-04-03 ENCOUNTER — Encounter: Payer: Managed Care, Other (non HMO) | Admitting: Physical Therapy

## 2019-04-07 ENCOUNTER — Other Ambulatory Visit: Payer: Self-pay | Admitting: Obstetrics & Gynecology

## 2019-05-02 ENCOUNTER — Other Ambulatory Visit: Payer: Self-pay

## 2019-05-02 ENCOUNTER — Ambulatory Visit (INDEPENDENT_AMBULATORY_CARE_PROVIDER_SITE_OTHER): Payer: Managed Care, Other (non HMO) | Admitting: Sports Medicine

## 2019-05-02 ENCOUNTER — Encounter: Payer: Self-pay | Admitting: Sports Medicine

## 2019-05-02 DIAGNOSIS — M5416 Radiculopathy, lumbar region: Secondary | ICD-10-CM | POA: Diagnosis not present

## 2019-05-02 NOTE — Progress Notes (Signed)
    Procedures performed today:    Procedure: Real-time Ultrasound Guided injection of the left sacroiliac joint Device: Samsung HS60  Verbal informed consent obtained.  Time-out conducted.  Noted no overlying erythema, induration, or other signs of local infection.  Skin prepped in a sterile fashion.  Local anesthesia: Topical Ethyl chloride.  With sterile technique and under real time ultrasound guidance:  Taking care to avoid the S1 foramen I guided a 22-gauge spinal needle into the sacroiliac joint and injected 1 cc Kenalog 40, 2 cc lidocaine, 2 cc bupivacaine injected easily Completed without difficulty  The patient did feel concordant pain during the injection procedure suggesting precise placement of the medication. Advised to call if fevers/chills, erythema, induration, drainage, or persistent bleeding.  Images permanently stored and available for review in the ultrasound unit.  Impression: Technically successful ultrasound guided injection.  Independent interpretation of tests performed by another provider:   None.  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 We have been treating her for left-sided L5 radiculitis, she had a left L5-S1 interlaminar epidural that provided good relief of some of her axial pain as well as some of her radicular pain. She continues to have some discomfort localized at the sacroiliac joint prolonged standing, today I injected her left sacroiliac joint with ultrasound guidance, pain was concordant during the injection. Return to see me in a month.    ___________________________________________ Gwen Her. Dianah Field, M.D., ABFM., CAQSM. Primary Care and [place] Instructor of [place] of [facility] of Medicine

## 2019-05-02 NOTE — Assessment & Plan Note (Signed)
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 We have been treating her for left-sided L5 radiculitis, she had a left L5-S1 interlaminar epidural that provided good relief of some of her axial pain as well as some of her radicular pain. She continues to have some discomfort localized at the sacroiliac joint prolonged standing, today I injected her left sacroiliac joint with ultrasound guidance, pain was concordant during the injection. Return to see me in a month.

## 2019-05-30 ENCOUNTER — Other Ambulatory Visit: Payer: Self-pay

## 2019-05-30 ENCOUNTER — Ambulatory Visit (INDEPENDENT_AMBULATORY_CARE_PROVIDER_SITE_OTHER): Payer: 59 | Admitting: Sports Medicine

## 2019-05-30 ENCOUNTER — Encounter: Payer: Self-pay | Admitting: Sports Medicine

## 2019-05-30 DIAGNOSIS — M5416 Radiculopathy, lumbar region: Secondary | ICD-10-CM | POA: Diagnosis not present

## 2019-05-30 MED ORDER — MELOXICAM 15 MG PO TABS: ORAL_TABLET | ORAL | 3 refills | Status: DC

## 2019-05-30 NOTE — Progress Notes (Signed)
    Procedures performed today:    None.  Independent interpretation of notes and tests performed by another provider:   On further review of her MRI she does have a fairly large disc herniation in the lateral recess on the left at [id number] that certainly could affect the L4 nerve root.  Impression and Recommendations:    Left lumbar radiculopathy This pleasant 44 year old female returns, she is our phlebotomist downstairs. We have been treating her for left-sided low back pain, she initially had a left L5-S1 interlaminar epidural that provided good relief of some of her axial pain and some of her radicular pain. At the last visit she was having axial pain localized at the sacroiliac joints with this was injected under ultrasound guidance. This provided no improvement in her relief whatsoever. On further review of her MRI she does have a fairly large disc herniation in the lateral recess on the left at [id number] that certainly could affect the L4 nerve root. She has a lesser so degree of foraminal stenosis at L3-L4. We are going to treat her with high-dose meloxicam for the next month and if insufficient relief we will proceed with a left L4-L5 transforaminal epidural.    ___________________________________________ Gwen Her. Dianah Field, M.D., ABFM., CAQSM. Primary Care and [place] Instructor of [place] of [facility] of Medicine

## 2019-05-30 NOTE — Assessment & Plan Note (Signed)
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 We have been treating her for left-sided low back pain, she initially had a left L5-S1 interlaminar epidural that provided good relief of some of her axial pain and some of her radicular pain. At the last visit she was having axial pain localized at the sacroiliac joints with this was injected under ultrasound guidance. This provided no improvement in her relief whatsoever. On further review of her MRI she does have a fairly large disc herniation in the lateral recess on the left at [id number] that certainly could affect the L4 nerve root. She has a lesser so degree of foraminal stenosis at L3-L4. We are going to treat her with high-dose meloxicam for the next month and if insufficient relief we will proceed with a left L4-L5 transforaminal epidural.

## 2019-06-25 ENCOUNTER — Telehealth: Payer: Self-pay | Admitting: Sports Medicine

## 2019-06-25 NOTE — Telephone Encounter (Signed)
Patient called and left a message about a muscle relaxer and I left her a message to call back and get further information.

## 2019-06-30 ENCOUNTER — Telehealth (INDEPENDENT_AMBULATORY_CARE_PROVIDER_SITE_OTHER): Payer: 59 | Admitting: Sports Medicine

## 2019-06-30 DIAGNOSIS — M5416 Radiculopathy, lumbar region: Secondary | ICD-10-CM | POA: Diagnosis not present

## 2019-06-30 MED ORDER — CYCLOBENZAPRINE HCL 10 MG PO TABS: ORAL_TABLET | ORAL | 0 refills | Status: DC

## 2019-06-30 NOTE — Progress Notes (Signed)
   Virtual Visit via WebEx/MyChart   I connected with  Marissa Chaney  on 06/30/19 via WebEx/MyChart/Doximity Video and verified that I am speaking with the correct person using two identifiers.   I discussed the limitations, risks, security and privacy concerns of performing an evaluation and management service by WebEx/MyChart/Doximity Video, including the higher likelihood of inaccurate diagnosis and treatment, and the availability of in person appointments.  We also discussed the likely need of an additional face to face encounter for complete and high quality delivery of care.  I also discussed with the patient that there may be a patient responsible charge related to this service. The patient expressed understanding and wishes to proceed.  Provider location is either at home or medical facility. Patient location is at their home, different from provider location. People involved in care of the patient during this telehealth encounter were myself, my nurse/medical assistant, and my front office/scheduling team member.  Review of Systems: No fevers, chills, night sweats, weight loss, chest pain, or shortness of breath.   Objective Findings:    General: Speaking full sentences, no audible heavy breathing.  Sounds alert and appropriately interactive.  Appears well.  Face symmetric.  Extraocular movements intact.  Pupils equal and round.  No nasal flaring or accessory muscle use visualized.  Independent interpretation of tests performed by another provider:   None.  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 We  had also seen a large disc herniation in the lateral recess on the left at L4-L5 likely affecting the left L4 nerve root, we started with meloxicam, she is requesting Flexeril so adding this now, if failure after 2 weeks we will proceed with another epidural but this time left L4-L5 transforaminal.   I discussed the above assessment and treatment plan with the patient. The patient was provided an opportunity to ask questions and all were answered. The patient agreed with the plan and demonstrated an understanding of the instructions.   The patient was advised to call back or seek an in-person evaluation if the symptoms worsen or if the condition fails to improve as anticipated.   I provided 30 minutes of face to face and non-face-to-face time during this encounter date, time was needed to gather information, review chart, records, communicate/coordinate with staff remotely, as well as complete documentation.   ___________________________________________ Gwen Her. Dianah Field, M.D., ABFM., CAQSM. Primary Care and [place] Instructor of [place] of [facility] of Medicine

## 2019-06-30 NOTE — Assessment & Plan Note (Signed)
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 We had also seen a large disc herniation in the lateral recess on the left at L4-L5 likely affecting the left L4 nerve root, we started with meloxicam, she is requesting Flexeril so adding this now, if failure after 2 weeks we will proceed with another epidural but this time left L4-L5 transforaminal.

## 2019-07-03 NOTE — Telephone Encounter (Signed)
l called patient back and was unable to leave a message. The patient had a visit with Dr. Darene Lamer on 06/30/2019.

## 2019-09-02 ENCOUNTER — Ambulatory Visit (INDEPENDENT_AMBULATORY_CARE_PROVIDER_SITE_OTHER): Payer: 59 | Admitting: Sports Medicine

## 2019-09-02 DIAGNOSIS — M5416 Radiculopathy, lumbar region: Secondary | ICD-10-CM | POA: Diagnosis not present

## 2019-09-02 MED ORDER — IBUPROFEN 800 MG PO TABS: 800.0000 mg | ORAL_TABLET | Freq: Three times a day (TID) | ORAL | 2 refills | Status: DC | PRN

## 2019-09-02 NOTE — Assessment & Plan Note (Signed)
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 We tried an SI joint injection with ultrasound guidance that provided no relief, not even temporary. She also has a disc protrusion in the lateral recess on the left likely affecting the left L4 nerve root so we are going to proceed with a left L4-L5 transforaminal epidural. Continue Flexeril, ibuprofen. I would also like a second opinion from spine surgery. Additional FMLA paperwork filled out today extending her intermittent leave until November.

## 2019-09-02 NOTE — Progress Notes (Signed)
    Procedures performed today:    None.  Independent interpretation of notes and tests performed by another provider:   None.  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 We tried an SI joint injection with ultrasound guidance that provided no relief, not even temporary. She also has a disc protrusion in the lateral recess on the left likely affecting the left L4 nerve root so we are going to proceed with a left L4-L5 transforaminal epidural. Continue Flexeril, ibuprofen. I would also like a second opinion from spine surgery. Additional FMLA paperwork filled out today extending her intermittent leave until November.    ___________________________________________ Gwen Her. Dianah Field, M.D., ABFM., CAQSM. Primary Care and [place] Instructor of [place] of [facility] of Medicine

## 2019-09-03 ENCOUNTER — Other Ambulatory Visit: Payer: Self-pay | Admitting: Sports Medicine

## 2019-09-03 DIAGNOSIS — M5416 Radiculopathy, lumbar region: Secondary | ICD-10-CM

## 2019-09-11 ENCOUNTER — Encounter: Payer: Self-pay | Admitting: Osteopathic Medicine

## 2019-09-11 ENCOUNTER — Ambulatory Visit (INDEPENDENT_AMBULATORY_CARE_PROVIDER_SITE_OTHER): Payer: 59 | Admitting: Osteopathic Medicine

## 2019-09-11 ENCOUNTER — Other Ambulatory Visit: Payer: Self-pay

## 2019-09-11 VITALS — BP 124/87 | HR 76 | Temp 98.2°F | Wt 171.1 lb

## 2019-09-11 DIAGNOSIS — E559 Vitamin D deficiency, unspecified: Secondary | ICD-10-CM

## 2019-09-11 DIAGNOSIS — R232 Flushing: Secondary | ICD-10-CM

## 2019-09-11 DIAGNOSIS — Z0000 Encounter for general adult medical examination without abnormal findings: Secondary | ICD-10-CM | POA: Diagnosis not present

## 2019-09-11 DIAGNOSIS — I10 Essential (primary) hypertension: Secondary | ICD-10-CM

## 2019-09-11 DIAGNOSIS — D649 Anemia, unspecified: Secondary | ICD-10-CM | POA: Diagnosis not present

## 2019-09-11 DIAGNOSIS — D869 Sarcoidosis, unspecified: Secondary | ICD-10-CM

## 2019-09-11 DIAGNOSIS — Z1231 Encounter for screening mammogram for malignant neoplasm of breast: Secondary | ICD-10-CM | POA: Diagnosis not present

## 2019-09-11 DIAGNOSIS — Z789 Other specified health status: Secondary | ICD-10-CM

## 2019-09-11 DIAGNOSIS — Z113 Encounter for screening for infections with a predominantly sexual mode of transmission: Secondary | ICD-10-CM

## 2019-09-11 NOTE — Patient Instructions (Signed)
General Preventive Care  Most recent routine screening labs: ordered 03/2019, follow up w/ rheumatology as directed.   Blood pressure goal 130/80 or less.   Tobacco: don't! Please let me know if you need help quitting!  Alcohol: responsible moderation is ok for most adults - if you have concerns about your alcohol intake, please talk to me!   Exercise: as tolerated to reduce risk of cardiovascular disease and diabetes. Strength training will also prevent osteoporosis.   Mental health: if need for mental health care (medicines, counseling, other), or concerns about moods, please let me know!   Sexual / Reproductive health: if need for STD testing, or if concerns with libido/pain problems, please let me know!   Advanced Directive: Living Will and/or Healthcare Power of Attorney recommended for all adults, regardless of age or health.  Vaccines  Flu vaccine: for almost everyone, every fall.   Shingles vaccine: after age 69.   Pneumonia vaccines: after age 21  Tetanus booster: every 10 years   COVID vaccine: STRONGLY RECOMMENDED - discuss with your rheumatologist  Cancer screenings   Colon cancer screening: for everyone age 29-75. Colonoscopy available for all, many people also qualify for the Cologuard stool test   Breast cancer screening: mammogram at age 75 every other year at least, and annually after age 59.   Cervical cancer screening: Pap not needed w/ hysterectomy.   Lung cancer screening: CT chest every year for those aged 20 to 57 years who have a 20 pack-year smoking history and currently smoke or have quit within the past 15 years  Infection screenings  . HIV: recommended screening at least once age 41-65 . Gonorrhea/Chlamydia: screening as needed . Hepatitis C: recommended once for everyone age 65-75 . TB: certain at-risk populations, or depending on work requirements and/or travel history Other . Bone Density Test: recommended for women at age 34

## 2019-09-11 NOTE — Progress Notes (Signed)
Marissa Chaney is a 44 y.o. female who presents to  [place] at [facility]  today, 09/11/19, seeking care for the following:  . Annual check-up - requests labs   Has appt w/ rheum    ASSESSMENT & PLAN with other pertinent findings:  The primary encounter diagnosis was Annual physical exam. Diagnoses of Sarcoidosis, Breast cancer screening by mammogram, Normocytic anemia, Essential hypertension, Vitamin D deficiency, Hypercalcemia, Vegan, Hot flashes, and Routine screening for STI (sexually transmitted infection) were also pertinent to this visit.   No results found for this or any previous visit (from the past 24 hour(s)).  BP Readings from Last 3 Encounters:  09/11/19 124/87  03/25/19 118/76  03/20/19 130/82    Patient Instructions  General Preventive Care  Most recent routine screening labs: ordered 03/2019, follow up w/ rheumatology as directed.   Blood pressure goal 130/80 or less.   Tobacco: don't! Please let me know if you need help quitting!  Alcohol: responsible moderation is ok for most adults - if you have concerns about your alcohol intake, please talk to me!   Exercise: as tolerated to reduce risk of cardiovascular disease and diabetes. Strength training will also prevent osteoporosis.   Mental health: if need for mental health care (medicines, counseling, other), or concerns about moods, please let me know!   Sexual / Reproductive health: if need for STD testing, or if concerns with libido/pain problems, please let me know!   Advanced Directive: Living Will and/or Healthcare Power of Attorney recommended for all adults, regardless of age or health.  Vaccines  Flu vaccine: for almost everyone, every fall.   Shingles vaccine: after age 12.   Pneumonia vaccines: after age 56  Tetanus booster: every 10 years   COVID vaccine: STRONGLY RECOMMENDED - discuss with your rheumatologist  Cancer screenings   Colon  cancer screening: for everyone age 81-75. Colonoscopy available for all, many people also qualify for the Cologuard stool test   Breast cancer screening: mammogram at age 58 every other year at least, and annually after age 48.   Cervical cancer screening: Pap not needed w/ hysterectomy.   Lung cancer screening: CT chest every year for those aged 84 to 78 years who have a 20 pack-year smoking history and currently smoke or have quit within the past 15 years  Infection screenings  . HIV: recommended screening at least once age 72-65 . Gonorrhea/Chlamydia: screening as needed . Hepatitis C: recommended once for everyone age 53-75 . TB: certain at-risk populations, or depending on work requirements and/or travel history Other . Bone Density Test: recommended for women at age 61  Requests broad lab panel - labs free for her  Advised Rheum might have additional labs to get   Orders Placed This Encounter  Procedures  . C. trachomatis/N. gonorrhoeae RNA  . Trichomonas vaginalis, RNA  . CBC with Differential/Platelet  . COMPLETE METABOLIC PANEL WITH GFR  . Lipid panel  . TSH  . Sedimentation rate  . Hemoglobin A1c  . VITAMIN D 25 Hydroxy (Vit-D Deficiency, Fractures)  . Urinalysis, Routine w reflex microscopic  . Vitamin B12  . FSH/LH  . HIV Antibody (routine testing w rflx)  . RPR  . Hepatic function panel    No orders of the defined types were placed in this encounter.   Constitutional:  . VSS, see nurse notes . General Appearance: alert, well-developed, well-nourished, NAD Eyes: Marland Kitchen Normal lids and conjunctive, non-icteric sclera Ears, Nose, Mouth,  Throat: . Normal appearance Neck: . No masses, trachea midline . No thyroid enlargement/tenderness/mass appreciated Respiratory: . Normal respiratory effort . Breath sounds normal, no wheeze/rhonchi/rales Cardiovascular: . S1/S2 normal, no murmur/rub/gallop auscultated . No lower extremity  edema Gastrointestinal: . Nontender, no masses . No hepatomegaly, no splenomegaly . No hernia appreciated Musculoskeletal:  . Gait normal . No clubbing/cyanosis of digits Neurological: . No cranial nerve deficit on limited exam . Motor and sensation intact and symmetric Psychiatric: . Normal judgment/insight . Normal mood and affect    Follow-up instructions: Return in about 1 year (around 09/10/2020) for [employer].                                         BP 124/87 (BP Location: Left Arm, Patient Position: Sitting, Cuff Size: Normal)   Pulse 76   Temp 98.2 F (36.8 C) (Oral)   Wt 171 lb 1.9 oz (77.6 kg)   LMP 11/19/2018   BMI 28.48 kg/m   Current Meds  Medication Sig  . amLODipine (NORVASC) 10 MG tablet Take 1 tablet (10 mg total) by mouth daily.  . cyclobenzaprine (FLEXERIL) 10 MG tablet One half to one tab PO qHS, then increase gradually to one tab TID.  Marland Kitchen ibuprofen (ADVIL) 800 MG tablet Take 1 tablet (800 mg total) by mouth every 8 (eight) hours as needed.  . Vitamin D, Ergocalciferol, (DRISDOL) 1.25 MG (50000 UNIT) CAPS capsule TAKE 1 CAPSULE (50,000 UNITS TOTAL) BY MOUTH EVERY 7 (SEVEN) DAYS.    No results found for this or any previous visit (from the past 72 hour(s)).  No results found.     All questions at time of visit were answered - patient instructed to contact office with any additional concerns or updates.  ER/RTC precautions were reviewed with the patient as applicable.   Please note: voice recognition software was used to produce this document, and typos may escape review. Please contact Dr. Sheppard Coil for any needed clarifications.

## 2019-09-15 LAB — LIPID PANEL
Cholesterol: 234 mg/dL — ABNORMAL HIGH (ref <200)
HDL: 71 mg/dL (ref >=50)
LDL Cholesterol (Calc): 148 mg/dL (calc) — ABNORMAL HIGH
Non-HDL Cholesterol (Calc): 163 mg/dL (calc) — ABNORMAL HIGH (ref <130)
Total CHOL/HDL Ratio: 3.3 (calc) (ref <5.0)
Triglycerides: 59 mg/dL (ref <150)

## 2019-09-15 LAB — CBC WITH DIFFERENTIAL/PLATELET
Absolute Monocytes: 531 cells/uL (ref 200–950)
Basophils Absolute: 52 cells/uL (ref 0–200)
Basophils Relative: 1.1 %
Eosinophils Absolute: 160 cells/uL (ref 15–500)
Eosinophils Relative: 3.4 %
HCT: 38.7 % (ref 35.0–45.0)
Hemoglobin: 13 g/dL (ref 11.7–15.5)
Lymphs Abs: 1410 cells/uL (ref 850–3900)
MCH: 32 pg (ref 27.0–33.0)
MCHC: 33.6 g/dL (ref 32.0–36.0)
MCV: 95.3 fL (ref 80.0–100.0)
MPV: 10.7 fL (ref 7.5–12.5)
Monocytes Relative: 11.3 %
Neutro Abs: 2547 cells/uL (ref 1500–7800)
Neutrophils Relative %: 54.2 %
Platelets: 316 10*3/uL (ref 140–400)
RBC: 4.06 10*6/uL (ref 3.80–5.10)
RDW: 12.1 % (ref 11.0–15.0)
Total Lymphocyte: 30 %
WBC: 4.7 10*3/uL (ref 3.8–10.8)

## 2019-09-15 LAB — HEPATIC FUNCTION PANEL
AG Ratio: 1.8 (calc) (ref 1.0–2.5)
ALT: 14 U/L (ref 6–29)
AST: 20 U/L (ref 10–30)
Albumin: 4.6 g/dL (ref 3.6–5.1)
Alkaline phosphatase (APISO): 94 U/L (ref 31–125)
Bilirubin, Direct: 0.1 mg/dL (ref 0.0–0.2)
Globulin: 2.6 g/dL (calc) (ref 1.9–3.7)
Indirect Bilirubin: 0.4 mg/dL (calc) (ref 0.2–1.2)
Total Bilirubin: 0.5 mg/dL (ref 0.2–1.2)
Total Protein: 7.2 g/dL (ref 6.1–8.1)

## 2019-09-15 LAB — C. TRACHOMATIS/N. GONORRHOEAE RNA
C. trachomatis RNA, TMA: NOT DETECTED
N. gonorrhoeae RNA, TMA: NOT DETECTED

## 2019-09-15 LAB — RPR: RPR Ser Ql: NONREACTIVE

## 2019-09-15 LAB — URINALYSIS, ROUTINE W REFLEX MICROSCOPIC
Bacteria, UA: NONE SEEN /HPF
Bilirubin Urine: NEGATIVE
Glucose, UA: NEGATIVE
Hgb urine dipstick: NEGATIVE
Hyaline Cast: NONE SEEN /LPF
Leukocytes,Ua: NEGATIVE
Nitrite: NEGATIVE
RBC / HPF: NONE SEEN /HPF (ref 0–2)
Specific Gravity, Urine: 1.024 (ref 1.001–1.03)
WBC, UA: NONE SEEN /HPF (ref 0–5)
pH: 5.5 (ref 5.0–8.0)

## 2019-09-15 LAB — COMPLETE METABOLIC PANEL WITH GFR
AG Ratio: 1.8 (calc) (ref 1.0–2.5)
ALT: 14 U/L (ref 6–29)
AST: 20 U/L (ref 10–30)
Albumin: 4.6 g/dL (ref 3.6–5.1)
Alkaline phosphatase (APISO): 94 U/L (ref 31–125)
BUN/Creatinine Ratio: 8 (calc) (ref 6–22)
BUN: 6 mg/dL — ABNORMAL LOW (ref 7–25)
CO2: 27 mmol/L (ref 20–32)
Calcium: 10.5 mg/dL — ABNORMAL HIGH (ref 8.6–10.2)
Chloride: 102 mmol/L (ref 98–110)
Creat: 0.71 mg/dL (ref 0.50–1.10)
GFR, Est African American: 122 mL/min/{1.73_m2} (ref >=60)
GFR, Est Non African American: 105 mL/min/{1.73_m2} (ref >=60)
Globulin: 2.6 g/dL (calc) (ref 1.9–3.7)
Glucose, Bld: 75 mg/dL (ref 65–99)
Potassium: 4.4 mmol/L (ref 3.5–5.3)
Sodium: 137 mmol/L (ref 135–146)
Total Bilirubin: 0.5 mg/dL (ref 0.2–1.2)
Total Protein: 7.2 g/dL (ref 6.1–8.1)

## 2019-09-15 LAB — TSH: TSH: 0.48 mIU/L

## 2019-09-15 LAB — HEMOGLOBIN A1C
Hgb A1c MFr Bld: 5.1 % of total Hgb (ref <5.7)
Mean Plasma Glucose: 100 (calc)
eAG (mmol/L): 5.5 (calc)

## 2019-09-15 LAB — FSH/LH
FSH: 3 m[IU]/mL
LH: 0.4 m[IU]/mL — ABNORMAL LOW

## 2019-09-15 LAB — TRICHOMONAS VAGINALIS, PROBE AMP: Trichomonas vaginalis RNA: NOT DETECTED

## 2019-09-15 LAB — VITAMIN B12: Vitamin B-12: 264 pg/mL (ref 200–1100)

## 2019-09-15 LAB — HIV ANTIBODY (ROUTINE TESTING W REFLEX): HIV 1&2 Ab, 4th Generation: NONREACTIVE

## 2019-09-15 LAB — SEDIMENTATION RATE: Sed Rate: 17 mm/h (ref 0–20)

## 2019-09-15 LAB — VITAMIN D 25 HYDROXY (VIT D DEFICIENCY, FRACTURES): Vit D, 25-Hydroxy: 24 ng/mL — ABNORMAL LOW (ref 30–100)

## 2019-09-19 ENCOUNTER — Ambulatory Visit: Payer: 59

## 2019-09-26 ENCOUNTER — Ambulatory Visit
Admission: RE | Admit: 2019-09-26 | Discharge: 2019-09-26 | Disposition: A | Discharge status: Home / Self Care | Payer: 59 | Source: Ambulatory Visit | Attending: Sports Medicine | Admitting: Sports Medicine

## 2019-09-26 DIAGNOSIS — M5416 Radiculopathy, lumbar region: Secondary | ICD-10-CM

## 2019-09-26 MED ORDER — IOPAMIDOL (ISOVUE-M 200) INJECTION 41%
1.0000 mL | Freq: Once | INTRAMUSCULAR | Status: AC
  Administered 2019-09-26: 1 mL via EPIDURAL

## 2019-09-26 MED ORDER — METHYLPREDNISOLONE ACETATE 40 MG/ML INJ SUSP (RADIOLOG
120.0000 mg | Freq: Once | INTRAMUSCULAR | Status: AC
  Administered 2019-09-26: 120 mg via EPIDURAL

## 2019-09-26 NOTE — Discharge Instructions (Signed)

## 2019-10-14 ENCOUNTER — Telehealth: Payer: Self-pay | Admitting: *Deleted

## 2019-10-14 NOTE — Telephone Encounter (Signed)
Pt left vm asking Dr. Darene Lamer to order covid antibodies test but I figured he would route it to you anyway. Please advise.

## 2019-10-15 NOTE — Telephone Encounter (Signed)
No If concerns about COVID needs virtual visit?  Antibody testing is not useful to determine immunity Get vaccinated

## 2019-10-16 NOTE — Telephone Encounter (Signed)
Pt notified of provider recommendations. 

## 2020-02-16 ENCOUNTER — Other Ambulatory Visit: Payer: Self-pay | Admitting: Rheumatology

## 2020-02-16 DIAGNOSIS — D869 Sarcoidosis, unspecified: Secondary | ICD-10-CM

## 2020-02-17 ENCOUNTER — Other Ambulatory Visit: Payer: Self-pay

## 2020-02-17 ENCOUNTER — Ambulatory Visit (INDEPENDENT_AMBULATORY_CARE_PROVIDER_SITE_OTHER): Payer: 59

## 2020-02-17 DIAGNOSIS — D869 Sarcoidosis, unspecified: Secondary | ICD-10-CM

## 2020-02-23 ENCOUNTER — Ambulatory Visit (INDEPENDENT_AMBULATORY_CARE_PROVIDER_SITE_OTHER): Payer: 59

## 2020-02-23 ENCOUNTER — Ambulatory Visit (INDEPENDENT_AMBULATORY_CARE_PROVIDER_SITE_OTHER): Payer: 59 | Admitting: Nurse Practitioner

## 2020-02-23 ENCOUNTER — Encounter: Payer: Self-pay | Admitting: Nurse Practitioner

## 2020-02-23 ENCOUNTER — Other Ambulatory Visit: Payer: Self-pay

## 2020-02-23 VITALS — BP 122/83 | HR 95 | Temp 98.1°F | Ht 65.0 in | Wt 176.7 lb

## 2020-02-23 DIAGNOSIS — M546 Pain in thoracic spine: Secondary | ICD-10-CM

## 2020-02-23 MED ORDER — PREDNISONE 50 MG PO TABS: 50.0000 mg | ORAL_TABLET | Freq: Every day | ORAL | 0 refills | Status: DC

## 2020-02-23 MED ORDER — OXYCODONE-ACETAMINOPHEN 5-325 MG PO TABS: 1.0000 | ORAL_TABLET | Freq: Four times a day (QID) | ORAL | 0 refills | Status: AC | PRN

## 2020-02-23 NOTE — Progress Notes (Signed)
Acute Office Visit  Subjective:    Patient ID: Marissa Chaney, female    DOB: 1975-07-11, 44 y.o.   MRN: [id number]  Chief Complaint  Patient presents with  . Back Pain    upper back pain, onset 2 weeks ago, possible exacerbation of sarcoidosis, pain is now radiating down her arm, taking ibuprofen 800 mg with no relief, spoke with her Rheumatologist who recommended she have an x-ray to see if it is an exacerbation of sarcoidosis    HPI Marissa Chaney is a 44 year old female presenting today with concerns of thoracic left sided upper back pain that has been ongoing for a little over 2 weeks. She reports the day before Thanksgiving she first noticed the upper back pain. She reports that she initially took 800mg  ibuprofen which seemed to help with her symptoms, but then the pain returned and it has no longer been effective. She also reports that a prescription for flexeril that she has has not been helpful. She reports that the pain radiated across the posterior left shoulder and down the left arm along the posterior and medial side.   She does have a history of sarcoidosis and recently had a chest CT for evaluation. The results do show the presence of sarcoidosis with possible presence of atypical infectious process presence vs. Inflammatory exacerbation.   At this time she denies known fevers, shortness of breath, productive cough, dizziness, weakness in extremities, or shortness of breath.  She does tell me that she had a dry cough about a month ago after her second COVID vaccine, but that has subsided.  Her most recent labs (end of October) reveal WBC of 5.4% and CRP 2.9.   She denies worsening of her pain with movement of her arms or head. She describes the pain as sharp and stabbing and at it's worst a 10/10. She reports the pain is constant and is present when she wakes up and all throughout the day. The pain does not increase with breathing.   Past Medical History:  Diagnosis Date  .  Cervical polyp 05/31/2018  . Hypertension   . Iron deficiency anemia 04/29/2018  . Sarcoidosis     Past Surgical History:  Procedure Laterality Date  . NO PAST SURGERIES    . VAGINAL HYSTERECTOMY Bilateral 12/10/2018   Procedure: HYSTERECTOMY VAGINAL WITH SALPINGECTOMY;  Surgeon: Emily Filbert, MD;  Location: [facility];  Service: Gynecology;  Laterality: Bilateral;    Family History  Problem Relation Age of Onset  . Hypertension Mother     Social History   Socioeconomic History  . Marital status: Married    Spouse name: Not on file  . Number of children: Not on file  . Years of education: Not on file  . Highest education level: Not on file  Occupational History  . Not on file  Tobacco Use  . Smoking status: Current Some Day Smoker    Types: Cigarettes  . Smokeless tobacco: Never Used  . Tobacco comment: 1-2 cig/day  Vaping Use  . Vaping Use: Never used  Substance and Sexual Activity  . Alcohol use: Yes    Alcohol/week: 1.0 - 2.0 standard drink    Types: 1 Glasses of wine per week  . Drug use: Never  . Sexual activity: Yes    Birth control/protection: None  Other Topics Concern  . Not on file  Social History Narrative  . Not on file   Social Determinants of Health   Financial Resource Strain:   .  Difficulty of Paying Living Expenses: Not on file  Food Insecurity:   . Worried About Charity fundraiser in the Last Year: Not on file  . Ran Out of Food in the Last Year: Not on file  Transportation Needs:   . Lack of Transportation (Medical): Not on file  . Lack of Transportation (Non-Medical): Not on file  Physical Activity:   . Days of Exercise per Week: Not on file  . Minutes of Exercise per Session: Not on file  Stress:   . Feeling of Stress : Not on file  Social Connections:   . Frequency of Communication with Friends and Family: Not on file  . Frequency of Social Gatherings with Friends and Family: Not on file  . Attends Religious Services: Not on file  .  Active Member of Clubs or Organizations: Not on file  . Attends Archivist Meetings: Not on file  . Marital Status: Not on file  Intimate Partner Violence:   . Fear of Current or Ex-Partner: Not on file  . Emotionally Abused: Not on file  . Physically Abused: Not on file  . Sexually Abused: Not on file    Outpatient Medications Prior to Visit  Medication Sig Dispense Refill  . amLODipine (NORVASC) 10 MG tablet Take 1 tablet (10 mg total) by mouth daily. 90 tablet 3  . cyclobenzaprine (FLEXERIL) 10 MG tablet One half to one tab PO qHS, then increase gradually to one tab TID. 30 tablet 0  . ibuprofen (ADVIL) 800 MG tablet Take 1 tablet (800 mg total) by mouth every 8 (eight) hours as needed. 90 tablet 2  . Vitamin D, Ergocalciferol, (DRISDOL) 1.25 MG (50000 UNIT) CAPS capsule TAKE 1 CAPSULE (50,000 UNITS TOTAL) BY MOUTH EVERY 7 (SEVEN) DAYS. 13 capsule 1   No facility-administered medications prior to visit.    Allergies  Allergen Reactions  . Blueberry [Vaccinium Angustifolium] Nausea And Vomiting    Review of Systems All review of systems negative except what is listed in the HPI     Objective:    Physical Exam Vitals and nursing note reviewed.  Constitutional:      Appearance: Normal appearance.  HENT:     Head: Normocephalic.  Eyes:     Extraocular Movements: Extraocular movements intact.     Conjunctiva/sclera: Conjunctivae normal.     Pupils: Pupils are equal, round, and reactive to light.  Cardiovascular:     Rate and Rhythm: Normal rate and regular rhythm.     Pulses: Normal pulses.     Heart sounds: Normal heart sounds.  Pulmonary:     Effort: Pulmonary effort is normal. No respiratory distress.     Breath sounds: Normal breath sounds. No wheezing or rhonchi.  Musculoskeletal:        General: Tenderness present. Normal range of motion.       Arms:     Cervical back: Tenderness present.  Lymphadenopathy:     Cervical: No cervical adenopathy.   Skin:    General: Skin is warm and dry.     Capillary Refill: Capillary refill takes less than 2 seconds.     Findings: No bruising, erythema or rash.  Neurological:     General: No focal deficit present.     Mental Status: She is alert and oriented to person, place, and time.     Sensory: No sensory deficit.     Motor: No weakness.     Coordination: Coordination normal.  Psychiatric:  Mood and Affect: Mood normal.        Behavior: Behavior normal.        Thought Content: Thought content normal.        Judgment: Judgment normal.     BP 122/83   Pulse 95   Temp 98.1 F (36.7 C)   Ht 5\' 5"  (1.651 m)   Wt 176 lb 11.2 oz (80.2 kg)   LMP 11/19/2018   SpO2 100%   BMI 29.40 kg/m  Wt Readings from Last 3 Encounters:  02/23/20 176 lb 11.2 oz (80.2 kg)  09/11/19 171 lb 1.9 oz (77.6 kg)  03/20/19 172 lb (78 kg)    There are no preventive care reminders to display for this patient.  There are no preventive care reminders to display for this patient.   Lab Results  Component Value Date   TSH 0.48 09/12/2019   Lab Results  Component Value Date   WBC 4.7 09/12/2019   HGB 13.0 09/12/2019   HCT 38.7 09/12/2019   MCV 95.3 09/12/2019   PLT 316 09/12/2019   Lab Results  Component Value Date   NA 137 09/12/2019   K 4.4 09/12/2019   CO2 27 09/12/2019   GLUCOSE 75 09/12/2019   BUN 6 (L) 09/12/2019   CREATININE 0.71 09/12/2019   BILITOT 0.5 09/12/2019   BILITOT 0.5 09/12/2019   AST 20 09/12/2019   AST 20 09/12/2019   ALT 14 09/12/2019   ALT 14 09/12/2019   PROT 7.2 09/12/2019   PROT 7.2 09/12/2019   CALCIUM 10.5 (H) 09/12/2019   ANIONGAP 7 12/06/2018   Lab Results  Component Value Date   CHOL 234 (H) 09/12/2019   Lab Results  Component Value Date   HDL 71 09/12/2019   Lab Results  Component Value Date   LDLCALC 148 (H) 09/12/2019   Lab Results  Component Value Date   TRIG 59 09/12/2019   Lab Results  Component Value Date   CHOLHDL 3.3 09/12/2019    Lab Results  Component Value Date   HGBA1C 5.1 09/12/2019       Assessment & Plan:   1. Acute left-sided thoracic back pain Pain appears to be stemming from the upper thoracic spine with suspect nerve impingement resulting in radiation pain down the left arm. 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  Also consider the possibility of exacerbation of autoimmune component with rheumatologic exacerbation causing inflammation and pain. Will obtain CRP today for detection of inflammatory process.  Discussed with patient the recent CT ordered by rheumatology- unfortunately, I do not have any records or images for comparison, so it is not clear if her lungs have worsened from previous imaging. Given that she is lacking evidence of consistent infectious process, I am highly doubtful that an infectious process is of concern with her upper back pain, but will obtain CBC today to monitor for WBC elevation and determine if further testing is needed based on results.  Will plan for xray of the thoracic spine today to evaluate for the possibility of disc or bony abnormality.  Recommend ice and heat along with stretching exercises daily. Handout provided.  Will send 5 day burst of prednisone for inflammation and short term pain medication for severe pain not resolved with antiinflammatory medications.  Recommend 2 week trial of above treatment and then follow-up with Dr. Darene Lamer for further evaluation is not resolved.   - DG Thoracic Spine 2 View - predniSONE (DELTASONE)  50 MG tablet; Take 1 tablet (50 mg total) by mouth daily.  Dispense: 5 tablet; Refill: 0 - oxyCODONE-acetaminophen (PERCOCET/ROXICET) 5-325 MG tablet; Take 1-2 tablets by mouth every 6 (six) hours as needed for up to 5 days for severe pain.  Dispense: 25 tablet; Refill: 0 - CBC with Differential/Platelet - C-reactive protein   Orma Render, NP

## 2020-02-23 NOTE — Patient Instructions (Signed)
Acute Back Pain, Adult Acute back pain is sudden and usually short-lived. It is often caused by an injury to the muscles and tissues in the back. The injury may result from:  A muscle or ligament getting overstretched or torn (strained). Ligaments are tissues that connect bones to each other. Lifting something improperly can cause a back strain.  Wear and tear (degeneration) of the spinal disks. Spinal disks are circular tissue that provides cushioning between the bones of the spine (vertebrae).  Twisting motions, such as while playing sports or doing yard work.  A hit to the back.  Arthritis. You may have a physical exam, lab tests, and imaging tests to find the cause of your pain. Acute back pain usually goes away with rest and home care. Follow these instructions at home: Managing pain, stiffness, and swelling  Take over-the-counter and prescription medicines only as told by your health care provider.  Your health care provider may recommend applying ice during the first 24-48 hours after your pain starts. To do this: ? Put ice in a plastic bag. ? Place a towel between your skin and the bag. ? Leave the ice on for 20 minutes, 2-3 times a day.  If directed, apply heat to the affected area as often as told by your health care provider. Use the heat source that your health care provider recommends, such as a moist heat pack or a heating pad. ? Place a towel between your skin and the heat source. ? Leave the heat on for 20-30 minutes. ? Remove the heat if your skin turns bright red. This is especially important if you are unable to feel pain, heat, or cold. You have a greater risk of getting burned. Activity   Do not stay in bed. Staying in bed for more than 1-2 days can delay your recovery.  Sit up and stand up straight. Avoid leaning forward when you sit, or hunching over when you stand. ? If you work at a desk, sit close to it so you do not need to lean over. Keep your chin tucked  in. Keep your neck drawn back, and keep your elbows bent at a right angle. Your arms should look like the letter "L." ? Sit high and close to the steering wheel when you drive. Add lower back (lumbar) support to your car seat, if needed.  Take short walks on even surfaces as soon as you are able. Try to increase the length of time you walk each day.  Do not sit, drive, or stand in one place for more than 30 minutes at a time. Sitting or standing for long periods of time can put stress on your back.  Do not drive or use heavy machinery while taking prescription pain medicine.  Use proper lifting techniques. When you bend and lift, use positions that put less stress on your back: ? Bend your knees. ? Keep the load close to your body. ? Avoid twisting.  Exercise regularly as told by your health care provider. Exercising helps your back heal faster and helps prevent back injuries by keeping muscles strong and flexible.  Work with a physical therapist to make a safe exercise program, as recommended by your health care provider. Do any exercises as told by your physical therapist. Lifestyle  Maintain a healthy weight. Extra weight puts stress on your back and makes it difficult to have good posture.  Avoid activities or situations that make you feel anxious or stressed. Stress and anxiety increase muscle   tension and can make back pain worse. Learn ways to manage anxiety and stress, such as through exercise. General instructions  Sleep on a firm mattress in a comfortable position. Try lying on your side with your knees slightly bent. If you lie on your back, put a pillow under your knees.  Follow your treatment plan as told by your health care provider. This may include: ? Cognitive or behavioral therapy. ? Acupuncture or massage therapy. ? Meditation or yoga. Contact a health care provider if:  You have pain that is not relieved with rest or medicine.  You have increasing pain going down  into your legs or buttocks.  Your pain does not improve after 2 weeks.  You have pain at night.  You lose weight without trying.  You have a fever or chills. Get help right away if:  You develop new bowel or bladder control problems.  You have unusual weakness or numbness in your arms or legs.  You develop nausea or vomiting.  You develop abdominal pain.  You feel faint. Summary  Acute back pain is sudden and usually short-lived.  Use proper lifting techniques. When you bend and lift, use positions that put less stress on your back.  Take over-the-counter and prescription medicines and apply heat or ice as directed by your health care provider. This information is not intended to replace advice given to you by your health care provider. Make sure you discuss any questions you have with your health care provider. Document Revised: 06/25/2018 Document Reviewed: 10/18/2016 Elsevier Patient Education  2020 [employer]  

## 2020-02-24 LAB — CBC WITH DIFFERENTIAL/PLATELET
Absolute Monocytes: 737 cells/uL (ref 200–950)
Basophils Absolute: 47 cells/uL (ref 0–200)
Basophils Relative: 0.7 %
Eosinophils Absolute: 409 cells/uL (ref 15–500)
Eosinophils Relative: 6.1 %
HCT: 37.8 % (ref 35.0–45.0)
Hemoglobin: 12.5 g/dL (ref 11.7–15.5)
Lymphs Abs: 1648 cells/uL (ref 850–3900)
MCH: 31.7 pg (ref 27.0–33.0)
MCHC: 33.1 g/dL (ref 32.0–36.0)
MCV: 95.9 fL (ref 80.0–100.0)
MPV: 10.6 fL (ref 7.5–12.5)
Monocytes Relative: 11 %
Neutro Abs: 3859 cells/uL (ref 1500–7800)
Neutrophils Relative %: 57.6 %
Platelets: 351 10*3/uL (ref 140–400)
RBC: 3.94 10*6/uL (ref 3.80–5.10)
RDW: 12.7 % (ref 11.0–15.0)
Total Lymphocyte: 24.6 %
WBC: 6.7 10*3/uL (ref 3.8–10.8)

## 2020-02-24 LAB — C-REACTIVE PROTEIN: CRP: 4.4 mg/L (ref <8.0)

## 2020-02-25 NOTE — Progress Notes (Signed)
Hi Marissa Chaney,   The x-ray doesn't show any significant changes, but this does not mean that a nerve is not being compressed causing your symptoms. I do recommend that you keep using the ice and heat a few times a day on the upper back/neck and do the neck exercises while finishing up the medication. If you are still having symptoms after a week or so, I suggest you follow-up with Dr. Darene Lamer for him to evaluate you further.   Your labs look good and don't show any indication of infection or acute inflammation that may represent an exacerbation of the sarcoidosis or other autoimmune condition.   Let me know if you have any questions! SaraBeth

## 2020-03-02 ENCOUNTER — Other Ambulatory Visit: Payer: Self-pay | Admitting: Nurse Practitioner

## 2020-03-02 DIAGNOSIS — Z1231 Encounter for screening mammogram for malignant neoplasm of breast: Secondary | ICD-10-CM

## 2020-03-03 ENCOUNTER — Other Ambulatory Visit: Payer: Self-pay

## 2020-03-03 ENCOUNTER — Other Ambulatory Visit: Payer: Self-pay | Admitting: Osteopathic Medicine

## 2020-03-03 ENCOUNTER — Ambulatory Visit (INDEPENDENT_AMBULATORY_CARE_PROVIDER_SITE_OTHER): Payer: 59

## 2020-03-03 DIAGNOSIS — Z1231 Encounter for screening mammogram for malignant neoplasm of breast: Secondary | ICD-10-CM | POA: Diagnosis not present

## 2020-04-05 ENCOUNTER — Other Ambulatory Visit: Payer: Self-pay | Admitting: Osteopathic Medicine

## 2020-05-31 ENCOUNTER — Emergency Department (INDEPENDENT_AMBULATORY_CARE_PROVIDER_SITE_OTHER)
Admission: EM | Admit: 2020-05-31 | Discharge: 2020-05-31 | Disposition: A | Discharge status: Home / Self Care | Payer: Self-pay | Source: Home / Self Care

## 2020-05-31 ENCOUNTER — Emergency Department (INDEPENDENT_AMBULATORY_CARE_PROVIDER_SITE_OTHER): Payer: 59

## 2020-05-31 DIAGNOSIS — S93402A Sprain of unspecified ligament of left ankle, initial encounter: Secondary | ICD-10-CM

## 2020-05-31 DIAGNOSIS — W19XXXA Unspecified fall, initial encounter: Secondary | ICD-10-CM | POA: Diagnosis not present

## 2020-05-31 DIAGNOSIS — M25572 Pain in left ankle and joints of left foot: Secondary | ICD-10-CM

## 2020-05-31 MED ORDER — IBUPROFEN 800 MG PO TABS: 800.0000 mg | ORAL_TABLET | Freq: Three times a day (TID) | ORAL | 0 refills | Status: DC | PRN

## 2020-05-31 MED ORDER — CYCLOBENZAPRINE HCL 10 MG PO TABS: 10.0000 mg | ORAL_TABLET | Freq: Two times a day (BID) | ORAL | 0 refills | Status: DC | PRN

## 2020-05-31 NOTE — ED Triage Notes (Signed)
Patient presents to Urgent Care with complaints of left ankle injury since twisting her ankle while walking out to her car during work. Patient reports the pain starts in her ankle and radiates into the top of her foot.

## 2020-05-31 NOTE — Discharge Instructions (Addendum)
Xrays are negative today for any fracture or misalignments  I have sent in ibuprofen for you to take one tablet every 8 hours as needed for pain and inflammation.  I have sent in flexeril for you to take twice a day as needed for muscle spasms. This medication can make you sleepy. Do not drive or operate heavy machinery with this medication.  We have placed a brace on your ankle  Use the crutches for about 3 days  Follow up with sports medicine if symptoms persist or worsen

## 2020-06-01 NOTE — ED Provider Notes (Signed)
[place]    CSN: [id number] Arrival date & time: 05/31/20  1432      History   Chief Complaint Chief Complaint  Patient presents with  . Ankle Pain    Left    HPI Marissa Chaney is a 45 y.o. female.   Reports that she tripped in the parking at work today. Reports that a sign fell over and was laying in the parking lot and that she tripped, fell and rolled her left ankle. Reports pain with weight bearing. Limited ROM of left ankle. Reports swelling and pain to the lateral aspect of the left ankle. Is concerned that she may have broken her ankle. Has not taken anything for pain. Denies previous symptoms. Denies bruising, hearing cracking or popping, hitting her ankle on anything.   ROS per HPI  The history is provided by the patient.  Ankle Pain   Past Medical History:  Diagnosis Date  . Cervical polyp 05/31/2018  . Hypertension   . Iron deficiency anemia 04/29/2018  . Sarcoidosis     Patient Active Problem List   Diagnosis Date Noted  . Post-operative state 12/10/2018  . Cervical polyp 05/31/2018  . Uterine leiomyoma 05/31/2018  . Myofascial pain 05/31/2018  . Lichen simplex chronicus 05/01/2018  . Chronic fatigue 05/01/2018  . Hypercalcemia 04/29/2018  . Iron deficiency anemia 04/29/2018  . Normocytic anemia 04/26/2018  . Vitamin D deficiency 04/26/2018  . Left lumbar radiculopathy 04/26/2018  . Intermenstrual bleeding 04/26/2018  . Hypertension goal BP (blood pressure) < 130/80 04/26/2018  . Tinea pedis of both feet 07/22/2015  . Sarcoidosis 09/09/2014  . Uveitis 06/03/2014    Past Surgical History:  Procedure Laterality Date  . NO PAST SURGERIES    . VAGINAL HYSTERECTOMY Bilateral 12/10/2018   Procedure: HYSTERECTOMY VAGINAL WITH SALPINGECTOMY;  Surgeon: Emily Filbert, MD;  Location: [facility];  Service: Gynecology;  Laterality: Bilateral;    OB History    Gravida  5   Para  2   Term      Preterm      AB  3   Living  2     SAB  1    IAB  2   Ectopic      Multiple      Live Births               Home Medications    Prior to Admission medications   Medication Sig Start Date End Date Taking? Authorizing Provider  cyclobenzaprine (FLEXERIL) 10 MG tablet Take 1 tablet (10 mg total) by mouth 2 (two) times daily as needed for muscle spasms. 05/31/20  Yes Faustino Congress, NP  ibuprofen (ADVIL) 800 MG tablet Take 1 tablet (800 mg total) by mouth every 8 (eight) hours as needed for moderate pain. 05/31/20  Yes Faustino Congress, NP  amLODipine (NORVASC) 10 MG tablet TAKE 1 TABLET BY MOUTH EVERY DAY 04/05/20   Emeterio Reeve, DO  predniSONE (DELTASONE) 50 MG tablet Take 1 tablet (50 mg total) by mouth daily. 02/23/20   Orma Render, NP  Vitamin D, Ergocalciferol, (DRISDOL) 1.25 MG (50000 UNIT) CAPS capsule TAKE 1 CAPSULE (50,000 UNITS TOTAL) BY MOUTH EVERY 7 (SEVEN) DAYS. 04/15/19   Emily Filbert, MD    Family History Family History  Problem Relation Age of Onset  . Hypertension Mother   . Healthy Father     Social History Social History   Tobacco Use  . Smoking status: Current Some Day Smoker  Types: Cigarettes  . Smokeless tobacco: Never Used  . Tobacco comment: 1-2 cig/day  Vaping Use  . Vaping Use: Never used  Substance Use Topics  . Alcohol use: Yes    Alcohol/week: 1.0 - 2.0 standard drink    Types: 1 Glasses of wine per week    Comment: socially  . Drug use: Never     Allergies   Blueberry [vaccinium angustifolium]   Review of Systems Review of Systems   Physical Exam Triage Vital Signs ED Triage Vitals  Enc Vitals Group     BP 05/31/20 1447 (!) 143/91     Pulse Rate 05/31/20 1447 83     Resp 05/31/20 1447 16     Temp 05/31/20 1447 98.9 F (37.2 C)     Temp Source 05/31/20 1447 Oral     SpO2 05/31/20 1447 99 %     Weight --      Height 05/31/20 1450 5\' 4"  (1.626 m)     Head Circumference --      Peak Flow --      Pain Score 05/31/20 1445 4     Pain Loc --      Pain  Edu? --      Excl. in [place]? --    No data found.  Updated Vital Signs BP (!) 143/91 (BP Location: Right Arm)   Pulse 83   Temp 98.9 F (37.2 C) (Oral)   Resp 16   Ht 5\' 4"  (1.626 m)   LMP 11/19/2018   SpO2 99%   BMI 30.33 kg/m   Visual Acuity Right Eye Distance:   Left Eye Distance:   Bilateral Distance:    Right Eye Near:   Left Eye Near:    Bilateral Near:     Physical Exam Vitals and nursing note reviewed.  Constitutional:      General: She is not in acute distress.    Appearance: Normal appearance. She is well-developed. She is not ill-appearing.  HENT:     Head: Normocephalic and atraumatic.     Nose: Nose normal.     Mouth/Throat:     Mouth: Mucous membranes are moist.     Pharynx: Oropharynx is clear.  Eyes:     Extraocular Movements: Extraocular movements intact.     Conjunctiva/sclera: Conjunctivae normal.     Pupils: Pupils are equal, round, and reactive to light.  Cardiovascular:     Rate and Rhythm: Normal rate and regular rhythm.     Heart sounds: No murmur heard.   Pulmonary:     Effort: Pulmonary effort is normal. No respiratory distress.     Breath sounds: Normal breath sounds.  Abdominal:     Palpations: Abdomen is soft.     Tenderness: There is no abdominal tenderness.  Musculoskeletal:        General: Swelling, tenderness and signs of injury present.     Cervical back: Normal range of motion and neck supple.     Comments: L ankle  Skin:    General: Skin is warm and dry.  Neurological:     General: No focal deficit present.     Mental Status: She is alert and oriented to person, place, and time.  Psychiatric:        Mood and Affect: Mood normal.        Behavior: Behavior normal.        Thought Content: Thought content normal.      UC Treatments / Results  Labs (all labs  ordered are listed, but only abnormal results are displayed) Labs Reviewed - No data to display  EKG   Radiology DG Ankle Complete Left  Result Date:  05/31/2020 CLINICAL DATA:  Pain and swelling after fall. EXAM: LEFT ANKLE COMPLETE - 3+ VIEW COMPARISON:  None. FINDINGS: There is no evidence of fracture, dislocation, or joint effusion. There is no evidence of arthropathy or other focal bone abnormality. Soft tissues are unremarkable. IMPRESSION: Negative left ankle radiographs. Electronically Signed   By: San Morelle M.D.   On: 05/31/2020 15:20   DG Foot Complete Left  Result Date: 05/31/2020 CLINICAL DATA:  Status post fall. EXAM: LEFT FOOT - COMPLETE 3+ VIEW COMPARISON:  None. FINDINGS: There is no evidence of fracture or dislocation. A tiny chronic cortical density is seen adjacent to the head of the fifth left metatarsal. There is no evidence of arthropathy or other focal bone abnormality. Soft tissues are unremarkable. IMPRESSION: Negative. Electronically Signed   By: Virgina Norfolk M.D.   On: 05/31/2020 15:25    Procedures Procedures (including critical care time)  Medications Ordered in UC Medications - No data to display  Initial Impression / Assessment and Plan / UC Course  I have reviewed the triage vital signs and the nursing notes.  Pertinent labs & imaging results that were available during my care of the patient were reviewed by me and considered in my medical decision making (see chart for details).    Fall Left Ankle Pain Left Ankle Sprain  Xray was negative today for fracture or misalignment ASO brace placed in office Crutches given in office Stay off the ankle for at least 3 days May use ibuprofen, tylenol for pain Prescribed ibuprofen Prescribed flexeril Do not drive and operate heavy machinery while taking this medication Use ice to the area and elevate your ankle Follow up with sports medicine if symptoms are not improving Follow up in the ER for acute worsening symptoms or severe pain Work note provided  Final Clinical Impressions(s) / UC Diagnoses   Final diagnoses:  Acute left ankle pain   Sprain of left ankle, unspecified ligament, initial encounter  Fall, initial encounter     Discharge Instructions     Xrays are negative today for any fracture or misalignments  I have sent in ibuprofen for you to take one tablet every 8 hours as needed for pain and inflammation.  I have sent in flexeril for you to take twice a day as needed for muscle spasms. This medication can make you sleepy. Do not drive or operate heavy machinery with this medication.  We have placed a brace on your ankle  Use the crutches for about 3 days  Follow up with sports medicine if symptoms persist or worsen    ED Prescriptions    Medication Sig Dispense Auth. Provider   ibuprofen (ADVIL) 800 MG tablet Take 1 tablet (800 mg total) by mouth every 8 (eight) hours as needed for moderate pain. 21 tablet Faustino Congress, NP   cyclobenzaprine (FLEXERIL) 10 MG tablet Take 1 tablet (10 mg total) by mouth 2 (two) times daily as needed for muscle spasms. 20 tablet Faustino Congress, NP     PDMP not reviewed this encounter.   Faustino Congress, NP 06/01/20 [phone]

## 2020-07-22 ENCOUNTER — Ambulatory Visit (INDEPENDENT_AMBULATORY_CARE_PROVIDER_SITE_OTHER): Payer: 59 | Admitting: Osteopathic Medicine

## 2020-07-22 ENCOUNTER — Encounter: Payer: Self-pay | Admitting: Osteopathic Medicine

## 2020-07-22 ENCOUNTER — Other Ambulatory Visit: Payer: Self-pay

## 2020-07-22 VITALS — BP 132/86 | HR 82 | Temp 98.9°F | Resp 20 | Ht 64.0 in | Wt 180.0 lb

## 2020-07-22 DIAGNOSIS — R600 Localized edema: Secondary | ICD-10-CM | POA: Diagnosis not present

## 2020-07-22 DIAGNOSIS — I10 Essential (primary) hypertension: Secondary | ICD-10-CM

## 2020-07-22 DIAGNOSIS — E559 Vitamin D deficiency, unspecified: Secondary | ICD-10-CM

## 2020-07-22 DIAGNOSIS — D869 Sarcoidosis, unspecified: Secondary | ICD-10-CM

## 2020-07-22 DIAGNOSIS — D509 Iron deficiency anemia, unspecified: Secondary | ICD-10-CM

## 2020-07-22 NOTE — Patient Instructions (Addendum)
Plan:  Get labs to look at electrolyte levels, iron, blood counts, inflammatory markers, vitamin D, diabetes   I don't have a strong suspicion for heart failure or any crazy reason for fluid retention   If labs all normal, would ask your rheumatologist:  Any reason sarcoidosis would cause bladder problems?  Any reason we couldn't try other BP medication (ARB or thiazide?)

## 2020-07-22 NOTE — Progress Notes (Signed)
Marissa Chaney is a 45 y.o. female who presents to  [facility]  today, 07/22/20, seeking care for the following:  . Drinks 60 oz water plus other drinks here and there. She is concerned that she urinates twice per day which she feels is not enough. Not feeling any urinary urgency. Reports ankle swelling more than usual. No SOB out of the ordinary. No orthopnea.      ASSESSMENT & PLAN with other pertinent findings:  The primary encounter diagnosis was Lower extremity edema. Diagnoses of Essential hypertension, Sarcoidosis, Iron deficiency anemia, unspecified iron deficiency anemia type, Vitamin D deficiency, and Hypertension goal BP (blood pressure) < 130/80 were also pertinent to this visit.   UA WNL in office  No concerns on PE other than trace LE edema Pt has rheumatology app tomorrow   Patient Instructions  Plan:  Get labs to look at electrolyte levels, iron, blood counts, inflammatory markers, vitamin D, diabetes   I don't have a strong suspicion for heart failure or any crazy reason for fluid retention   If labs all normal, would ask your rheumatologist:  Any reason sarcoidosis would cause bladder problems?  Any reason we couldn't try other BP medication (ARB or thiazide?)    Orders Placed This Encounter  Procedures  . CBC with Differential/Platelet  . COMPLETE METABOLIC PANEL WITH GFR  . TSH  . High sensitivity CRP  . B Nat Peptide  . VITAMIN D 25 Hydroxy (Vit-D Deficiency, Fractures)  . Fe+TIBC+Fer  . Hemoglobin A1c    No orders of the defined types were placed in this encounter.    See below for relevant physical exam findings  See below for recent lab and imaging results reviewed  Medications, allergies, PMH, PSH, SocH, FamH reviewed below    Follow-up instructions: Return for RECHECK PENDING RESULTS / IF WORSE OR  CHANGE.                                        Exam:  BP 132/86 (BP Location: Left Arm, Patient Position: Sitting, Cuff Size: Normal)   Pulse 82   Temp 98.9 F (37.2 C) (Oral)   Resp 20   Ht 5\' 4"  (1.626 m)   Wt 180 lb (81.6 kg)   LMP 11/19/2018   SpO2 100%   BMI 30.90 kg/m   Constitutional: VS see above. General Appearance: alert, well-developed, well-nourished, NAD  Neck: No masses, trachea midline.   Respiratory: Normal respiratory effort. no wheeze, no rhonchi, no rales  Cardiovascular: S1/S2 normal, no murmur, no rub/gallop auscultated. RRR. Trace to +1 edema bilateral LE   Musculoskeletal: Gait normal. Symmetric and independent movement of all extremities  Neurological: Normal balance/coordination. No tremor.  Skin: warm, dry, intact.   Psychiatric: Normal judgment/insight. Normal mood and affect. Oriented x3.   Current Meds  Medication Sig  . amLODipine (NORVASC) 10 MG tablet TAKE 1 TABLET BY MOUTH EVERY DAY  . cyclobenzaprine (FLEXERIL) 10 MG tablet Take 1 tablet (10 mg total) by mouth 2 (two) times daily as needed for muscle spasms.  Marland Kitchen ibuprofen (ADVIL) 800 MG tablet Take 1 tablet (800 mg total) by mouth every 8 (eight) hours as needed for moderate pain.  . Vitamin D, Ergocalciferol, (DRISDOL) 1.25 MG (50000 UNIT) CAPS capsule TAKE 1 CAPSULE (50,000 UNITS TOTAL) BY MOUTH EVERY 7 (SEVEN) DAYS.    Allergies  Allergen Reactions  . Blueberry [Vaccinium Angustifolium] Nausea And Vomiting    Patient Active Problem List   Diagnosis Date Noted  . Post-operative state 12/10/2018  . Cervical polyp 05/31/2018  . Uterine leiomyoma 05/31/2018  . Myofascial pain 05/31/2018  . Lichen simplex chronicus 05/01/2018  . Chronic fatigue 05/01/2018  . Hypercalcemia 04/29/2018  . Iron deficiency anemia 04/29/2018  . Normocytic anemia 04/26/2018  . Vitamin D deficiency 04/26/2018  . Left lumbar radiculopathy 04/26/2018  . Intermenstrual  bleeding 04/26/2018  . Hypertension goal BP (blood pressure) < 130/80 04/26/2018  . Tinea pedis of both feet 07/22/2015  . Sarcoidosis 09/09/2014  . Uveitis 06/03/2014    Family History  Problem Relation Age of Onset  . Hypertension Mother   . Healthy Father     Social History   Tobacco Use  Smoking Status Current Some Day Smoker  . Types: Cigarettes  Smokeless Tobacco Never Used  Tobacco Comment   1-2 cig/day    Past Surgical History:  Procedure Laterality Date  . NO PAST SURGERIES    . VAGINAL HYSTERECTOMY Bilateral 12/10/2018   Procedure: HYSTERECTOMY VAGINAL WITH SALPINGECTOMY;  Surgeon: Emily Filbert, MD;  Location: [facility];  Service: Gynecology;  Laterality: Bilateral;    Immunization History  Administered Date(s) Administered  . Moderna Sars-Covid-2 Vaccination 01/01/2020, 01/29/2020  . Tdap 05/13/2018    No results found for this or any previous visit (from the past 2160 hour(s)).  No results found.     All questions at time of visit were answered - patient instructed to contact office with any additional concerns or updates. ER/RTC precautions were reviewed with the patient as applicable.   Please note: manual typing as well as voice recognition software may have been used to produce this document - typos may escape review. Please contact Dr. Sheppard Coil for any needed clarifications.

## 2020-07-23 LAB — COMPLETE METABOLIC PANEL WITH GFR
AG Ratio: 1.9 (calc) (ref 1.0–2.5)
ALT: 13 U/L (ref 6–29)
AST: 19 U/L (ref 10–30)
Albumin: 4.4 g/dL (ref 3.6–5.1)
Alkaline phosphatase (APISO): 97 U/L (ref 31–125)
BUN: 8 mg/dL (ref 7–25)
CO2: 28 mmol/L (ref 20–32)
Calcium: 10.1 mg/dL (ref 8.6–10.2)
Chloride: 105 mmol/L (ref 98–110)
Creat: 0.67 mg/dL (ref 0.50–1.10)
GFR, Est African American: 125 mL/min/{1.73_m2} (ref >=60)
GFR, Est Non African American: 108 mL/min/{1.73_m2} (ref >=60)
Globulin: 2.3 g/dL (calc) (ref 1.9–3.7)
Glucose, Bld: 91 mg/dL (ref 65–139)
Potassium: 4.6 mmol/L (ref 3.5–5.3)
Sodium: 139 mmol/L (ref 135–146)
Total Bilirubin: 0.3 mg/dL (ref 0.2–1.2)
Total Protein: 6.7 g/dL (ref 6.1–8.1)

## 2020-07-23 LAB — IRON,TIBC AND FERRITIN PANEL
%SAT: 24 % (calc) (ref 16–45)
Ferritin: 18 ng/mL (ref 16–232)
Iron: 91 ug/dL (ref 40–190)
TIBC: 383 mcg/dL (calc) (ref 250–450)

## 2020-07-23 LAB — HEMOGLOBIN A1C
Hgb A1c MFr Bld: 5.1 % of total Hgb (ref <5.7)
Mean Plasma Glucose: 100 mg/dL
eAG (mmol/L): 5.5 mmol/L

## 2020-07-23 LAB — CBC WITH DIFFERENTIAL/PLATELET
Absolute Monocytes: 672 cells/uL (ref 200–950)
Basophils Absolute: 58 cells/uL (ref 0–200)
Basophils Relative: 0.9 %
Eosinophils Absolute: 230 cells/uL (ref 15–500)
Eosinophils Relative: 3.6 %
HCT: 37.6 % (ref 35.0–45.0)
Hemoglobin: 12.3 g/dL (ref 11.7–15.5)
Lymphs Abs: 1843 cells/uL (ref 850–3900)
MCH: 31.4 pg (ref 27.0–33.0)
MCHC: 32.7 g/dL (ref 32.0–36.0)
MCV: 95.9 fL (ref 80.0–100.0)
MPV: 10.7 fL (ref 7.5–12.5)
Monocytes Relative: 10.5 %
Neutro Abs: 3597 cells/uL (ref 1500–7800)
Neutrophils Relative %: 56.2 %
Platelets: 305 10*3/uL (ref 140–400)
RBC: 3.92 10*6/uL (ref 3.80–5.10)
RDW: 12.6 % (ref 11.0–15.0)
Total Lymphocyte: 28.8 %
WBC: 6.4 10*3/uL (ref 3.8–10.8)

## 2020-07-23 LAB — HIGH SENSITIVITY CRP: hs-CRP: 2.3 mg/L

## 2020-07-23 LAB — BRAIN NATRIURETIC PEPTIDE: Brain Natriuretic Peptide: 34 pg/mL (ref <100)

## 2020-07-23 LAB — TSH: TSH: 0.54 mIU/L

## 2020-07-23 LAB — VITAMIN D 25 HYDROXY (VIT D DEFICIENCY, FRACTURES): Vit D, 25-Hydroxy: 19 ng/mL — ABNORMAL LOW (ref 30–100)

## 2020-07-23 MED ORDER — HYDROCHLOROTHIAZIDE 12.5 MG PO TABS: 12.5000 mg | ORAL_TABLET | Freq: Every day | ORAL | 3 refills | Status: DC

## 2020-07-23 NOTE — Addendum Note (Signed)
Addended by: Maryla Morrow on: 07/23/2020 11:15 AM   Modules accepted: Orders

## 2020-09-14 ENCOUNTER — Telehealth: Payer: Self-pay | Admitting: *Deleted

## 2020-09-14 NOTE — Telephone Encounter (Signed)
Left patient an urgent message to call or come by the office to reschedule appointment for 09/16/2020 due to Dr. Elly Modena being out that day.

## 2020-09-14 NOTE — Telephone Encounter (Signed)
Left patient an urgent message to call and reschedule appointment for 09/16/2020 due to Dr. Elly Modena being out that day.

## 2020-09-16 ENCOUNTER — Ambulatory Visit: Payer: 59 | Admitting: Obstetrics and Gynecology

## 2020-09-30 ENCOUNTER — Ambulatory Visit: Payer: 59 | Admitting: Obstetrics and Gynecology

## 2020-10-07 ENCOUNTER — Encounter: Payer: Self-pay | Admitting: Obstetrics and Gynecology

## 2020-10-07 ENCOUNTER — Ambulatory Visit (INDEPENDENT_AMBULATORY_CARE_PROVIDER_SITE_OTHER): Payer: 59 | Admitting: Obstetrics and Gynecology

## 2020-10-07 ENCOUNTER — Other Ambulatory Visit: Payer: Self-pay

## 2020-10-07 ENCOUNTER — Other Ambulatory Visit (HOSPITAL_COMMUNITY)
Admission: RE | Admit: 2020-10-07 | Discharge: 2020-10-07 | Disposition: A | Discharge status: Home / Self Care | Payer: 59 | Source: Ambulatory Visit | Attending: Obstetrics and Gynecology | Admitting: Obstetrics and Gynecology

## 2020-10-07 VITALS — BP 135/89 | HR 82 | Ht 64.0 in | Wt 176.0 lb

## 2020-10-07 DIAGNOSIS — R635 Abnormal weight gain: Secondary | ICD-10-CM

## 2020-10-07 DIAGNOSIS — Z113 Encounter for screening for infections with a predominantly sexual mode of transmission: Secondary | ICD-10-CM | POA: Insufficient documentation

## 2020-10-07 DIAGNOSIS — Z90710 Acquired absence of both cervix and uterus: Secondary | ICD-10-CM

## 2020-10-07 DIAGNOSIS — R4586 Emotional lability: Secondary | ICD-10-CM | POA: Diagnosis not present

## 2020-10-07 DIAGNOSIS — E559 Vitamin D deficiency, unspecified: Secondary | ICD-10-CM

## 2020-10-07 DIAGNOSIS — Z01419 Encounter for gynecological examination (general) (routine) without abnormal findings: Secondary | ICD-10-CM | POA: Diagnosis not present

## 2020-10-07 DIAGNOSIS — R232 Flushing: Secondary | ICD-10-CM | POA: Diagnosis not present

## 2020-10-07 NOTE — Progress Notes (Signed)
GYNECOLOGY ANNUAL PREVENTATIVE CARE ENCOUNTER NOTE  Subjective:   Marissa Chaney is a 45 y.o. [id number] female here for a annual gynecologic exam. Current complaints: hot flashes, night sweats. Has gained 15-20 pounds and is fatigued.   Denies abnormal vaginal bleeding, discharge, pelvic pain, problems with intercourse or other gynecologic concerns. Accepts STI screen.   Gynecologic History Patient's last menstrual period was 11/19/2018. Contraception: status post hysterectomy Last Pap: none in system, s/p vaginal hysterectomy Last mammogram: 12/21. Results: Birads 1 DEXA: has never had  Obstetric History OB History  Gravida Para Term Preterm AB Living  5 2     3 2   SAB IAB Ectopic Multiple Live Births  1 2          # Outcome Date GA Lbr Len/2nd Weight Sex Delivery Anes PTL Lv  5 Para           4 Para           3 IAB           2 IAB           1 SAB             Past Medical History:  Diagnosis Date   Cervical polyp 05/31/2018   Hypertension    Iron deficiency anemia 04/29/2018   Sarcoidosis     Past Surgical History:  Procedure Laterality Date   NO PAST SURGERIES     VAGINAL HYSTERECTOMY Bilateral 12/10/2018   Procedure: HYSTERECTOMY VAGINAL WITH SALPINGECTOMY;  Surgeon: Emily Filbert, MD;  Location: [facility];  Service: Gynecology;  Laterality: Bilateral;    Current Outpatient Medications on File Prior to Visit  Medication Sig Dispense Refill   amLODipine (NORVASC) 10 MG tablet Take 10 mg by mouth daily.     hydrochlorothiazide (HYDRODIURIL) 12.5 MG tablet Take 1 tablet (12.5 mg total) by mouth daily. 90 tablet 3   cyclobenzaprine (FLEXERIL) 10 MG tablet Take 1 tablet (10 mg total) by mouth 2 (two) times daily as needed for muscle spasms. (Patient not taking: Reported on 10/07/2020) 20 tablet 0   ibuprofen (ADVIL) 800 MG tablet Take 1 tablet (800 mg total) by mouth every 8 (eight) hours as needed for moderate pain. (Patient not taking: Reported on 10/07/2020) 21 tablet 0    Vitamin D, Ergocalciferol, (DRISDOL) 1.25 MG (50000 UNIT) CAPS capsule TAKE 1 CAPSULE (50,000 UNITS TOTAL) BY MOUTH EVERY 7 (SEVEN) DAYS. (Patient not taking: Reported on 10/07/2020) 13 capsule 1   No current facility-administered medications on file prior to visit.    Allergies  Allergen Reactions   Blueberry [Vaccinium Angustifolium] Nausea And Vomiting    Social History   Socioeconomic History   Marital status: Married    Spouse name: Not on file   Number of children: Not on file   Years of education: Not on file   Highest education level: Not on file  Occupational History   Not on file  Tobacco Use   Smoking status: Some Days    Types: Cigarettes   Smokeless tobacco: Never   Tobacco comments:    1-2 cig/day  Vaping Use   Vaping Use: Never used  Substance and Sexual Activity   Alcohol use: Yes    Alcohol/week: 1.0 - 2.0 standard drink    Types: 1 Glasses of wine per week    Comment: socially   Drug use: Never   Sexual activity: Yes    Birth control/protection: None  Other Topics Concern  Not on file  Social History Narrative   Not on file   Social Determinants of Health   Financial Resource Strain: Not on file  Food Insecurity: Not on file  Transportation Needs: Not on file  Physical Activity: Not on file  Stress: Not on file  Social Connections: Not on file  Intimate Partner Violence: Not on file    Family History  Problem Relation Age of Onset   Hypertension Mother    Healthy Father     The following portions of the patient's history were reviewed and updated as appropriate: allergies, current medications, past family history, past medical history, past social history, past surgical history and problem list.  Review of Systems Pertinent items are noted in HPI.   Objective:  BP 135/89   Pulse 82   Ht 5\' 4"  (1.626 m)   Wt 176 lb (79.8 kg)   LMP 11/19/2018   BMI 30.21 kg/m  CONSTITUTIONAL: Well-developed, well-nourished female in no acute  distress.  HENT:  Normocephalic, atraumatic, External right and left ear normal. Oropharynx is clear and moist EYES: Conjunctivae and EOM are normal. Pupils are equal, round, and reactive to light. No scleral icterus.  NECK: Normal range of motion, supple, no masses.  Normal thyroid.  SKIN: Skin is warm and dry. No rash noted. Not diaphoretic. No erythema. No pallor. NEUROLOGIC: Alert and oriented to person, place, and time. Normal reflexes, muscle tone coordination. No cranial nerve deficit noted. PSYCHIATRIC: Normal mood and affect. Normal behavior. Normal judgment and thought content. CARDIOVASCULAR: Normal heart rate noted RESPIRATORY: Effort normal, no problems with respiration noted. BREASTS: Symmetric in size. No masses, skin changes, nipple drainage, or lymphadenopathy. ABDOMEN: Soft, no distention noted.  No tenderness, rebound or guarding.  PELVIC: Normal appearing external genitalia; normal appearing vaginal mucosa and cuff.  No abnormal discharge noted.  Pelvic cultures obtained. Uterus surgically absent, no other palpable masses, no adnexal tenderness. MUSCULOSKELETAL: Normal range of motion. No tenderness.  No cyanosis, clubbing, or edema.  2+ distal pulses.  Exam done with chaperone present.   Assessment and Plan:  1. Well woman exam Healthy female eaxm  2. S/P hysterectomy  3. Hot flashes Reviewed menopause, symptoms, symptomatic management, can try OTC remifemin and if no improvement, consider Paroxetine, she is agreeable - [place]  4. Mood swings  5. Routine screening for STI (sexually transmitted infection) - Cervicovaginal ancillary only( [place]) - Hepatitis B surface antigen - Hepatitis C antibody - HIV Antibody (routine testing w rflx) - RPR  6. Vitamin D deficiency - Vitamin D (25 hydroxy)  7. Weight gain - TSH   Will followup results of STI screen and manage accordingly. Encouraged improvement in diet and exercise.  COVID vaccine UTD Accepts STI  screen. Mammogram UTD Referral for colonoscopy n/a Flu vaccine n/a DEXA not due based on age  Routine preventative health maintenance measures emphasized. Please refer to After Visit Summary for other counseling recommendations.    Feliz Beam, MD, [place] [facility])

## 2020-10-07 NOTE — Patient Instructions (Signed)
Look for Remifemin at your local pharmacy. This is a non-prescription herbal remedy called black cohosh that may improve your menopausal symptoms. Please read the inserts as black cohosh can interfere with other medications and medical conditions. Please call if you have any questions before taking. This may take several weeks/months to show an improvement. If this herbal remedy does not improve your symptoms, please let me know, and we will discuss other options.

## 2020-10-11 LAB — CERVICOVAGINAL ANCILLARY ONLY
Chlamydia: NEGATIVE
Comment: NEGATIVE
Comment: NEGATIVE
Comment: NORMAL
Neisseria Gonorrhea: NEGATIVE
Trichomonas: NEGATIVE

## 2020-10-15 LAB — VITAMIN D 25 HYDROXY (VIT D DEFICIENCY, FRACTURES): Vit D, 25-Hydroxy: 14 ng/mL — ABNORMAL LOW (ref 30–100)

## 2020-10-15 LAB — HEPATITIS C ANTIBODY
Hepatitis C Ab: NONREACTIVE
SIGNAL TO CUT-OFF: 0.01 (ref <1.00)

## 2020-10-15 LAB — TSH: TSH: 0.62 mIU/L

## 2020-10-15 LAB — HIV ANTIBODY (ROUTINE TESTING W REFLEX): HIV 1&2 Ab, 4th Generation: NONREACTIVE

## 2020-10-15 LAB — HEPATITIS B SURFACE ANTIGEN: Hepatitis B Surface Ag: NONREACTIVE

## 2020-10-15 LAB — FOLLICLE STIMULATING HORMONE: FSH: 10.7 m[IU]/mL

## 2020-10-15 LAB — RPR: RPR Ser Ql: NONREACTIVE

## 2020-10-16 ENCOUNTER — Other Ambulatory Visit: Payer: Self-pay | Admitting: Family Medicine

## 2020-10-16 MED ORDER — VITAMIN D (ERGOCALCIFEROL) 1.25 MG (50000 UNIT) PO CAPS: 50000.0000 [IU] | ORAL_CAPSULE | ORAL | 1 refills | Status: DC

## 2021-04-05 ENCOUNTER — Other Ambulatory Visit: Payer: Self-pay

## 2021-04-05 ENCOUNTER — Ambulatory Visit (INDEPENDENT_AMBULATORY_CARE_PROVIDER_SITE_OTHER): Payer: 59 | Admitting: Physician Assistant

## 2021-04-05 ENCOUNTER — Encounter: Payer: Self-pay | Admitting: Physician Assistant

## 2021-04-05 VITALS — BP 125/84 | HR 90 | Ht 64.0 in | Wt 181.0 lb

## 2021-04-05 DIAGNOSIS — R519 Headache, unspecified: Secondary | ICD-10-CM | POA: Insufficient documentation

## 2021-04-05 MED ORDER — FLUTICASONE PROPIONATE 50 MCG/ACT NA SUSP: 2.0000 | Freq: Every day | NASAL | 2 refills | Status: DC

## 2021-04-05 MED ORDER — DEXAMETHASONE SODIUM PHOSPHATE 10 MG/ML IJ SOLN
10.0000 mg | Freq: Once | INTRAMUSCULAR | Status: AC
  Administered 2021-04-05: 10 mg via INTRAMUSCULAR

## 2021-04-05 MED ORDER — KETOROLAC TROMETHAMINE 60 MG/2ML IM SOLN
60.0000 mg | Freq: Once | INTRAMUSCULAR | Status: AC
  Administered 2021-04-05: 60 mg via INTRAMUSCULAR

## 2021-04-05 NOTE — Progress Notes (Signed)
Subjective:     Patient ID: Marissa Chaney, female   DOB: 12/09/1975, 46 y.o.   MRN: [id number]  Headache  Associated symptoms include back pain, nausea and sinus pressure. Pertinent negatives include no coughing, dizziness, ear pain, eye pain, fever, hearing loss, photophobia, rhinorrhea, sore throat or vomiting.  46 YO female patient presents to the clinic complaining of a headache x2 days. Patient reports she had a headache yesterday from around 10 am - 5 pm, which subsided with ibuprofen. She currently has a headache across the front of her head and maxillary sinuses. She reports nausea and fatigue. Patient denies any congestion, cough, or runny nose. She denies any speech changes, sensitivity to light, sensitivity to sound, or any ringing.   Review of Systems  Constitutional:  Positive for fatigue. Negative for chills and fever.  HENT:  Positive for sinus pressure. Negative for congestion, ear pain, hearing loss, rhinorrhea, sore throat and voice change.   Eyes:  Negative for photophobia, pain and visual disturbance.  Respiratory:  Negative for cough and shortness of breath.   Gastrointestinal:  Positive for nausea. Negative for constipation, diarrhea and vomiting.  Musculoskeletal:  Positive for back pain.  Skin:  Positive for rash.  Neurological:  Positive for headaches. Negative for dizziness, syncope and light-headedness.      Objective:   Physical Exam Constitutional:      General: She is not in acute distress.    Appearance: She is well-developed. She is obese. She is not ill-appearing, toxic-appearing or diaphoretic.  HENT:     Head: Normocephalic and atraumatic.     Right Ear: Tympanic membrane, ear canal and external ear normal.     Left Ear: Tympanic membrane, ear canal and external ear normal.     Nose:     Right Turbinates: Swollen.     Left Turbinates: Swollen.     Right Sinus: Maxillary sinus tenderness and frontal sinus tenderness present.     Left Sinus:  Maxillary sinus tenderness and frontal sinus tenderness present.     Comments: Congested turbinates Eyes:     Extraocular Movements: Extraocular movements intact.     Pupils: Pupils are equal, round, and reactive to light.  Cardiovascular:     Rate and Rhythm: Normal rate and regular rhythm.     Heart sounds: Normal heart sounds.  Pulmonary:     Effort: Pulmonary effort is normal.     Breath sounds: Normal breath sounds.  Musculoskeletal:     Cervical back: Normal range of motion and neck supple.  Skin:    General: Skin is warm and dry.  Neurological:     Mental Status: She is alert and oriented to person, place, and time.     Comments: PERRLA, EOMs intact Symmetrical smile, patient able to keep cheeks puffed against resistance.  Uvula midline, speech normal  Psychiatric:        Mood and Affect: Mood normal.        Speech: Speech normal.        Behavior: Behavior normal.       Assessment:       Marland KitchenMarland KitchenKendre was seen today for headache.  Diagnoses and all orders for this visit:  Sinus headache -     fluticasone (FLONASE) 50 MCG/ACT nasal spray; Place 2 sprays into both nostrils daily. -     ketorolac (TORADOL) injection 60 mg -     dexamethasone (DECADRON) injection 10 mg    Plan:    Gave toradol injection and  decadron injection to abort symptoms. Start flonase to improve sinus inflammation. Educated patient on benefit of sinus rinses. Told patient, if her symptoms worsen or change to let us know. Will consider adding a steroid burst or antibiotic if symptoms persist or worsen.

## 2021-04-06 ENCOUNTER — Encounter: Payer: Self-pay | Admitting: Physician Assistant

## 2021-04-06 ENCOUNTER — Telehealth: Payer: Self-pay | Admitting: Osteopathic Medicine

## 2021-04-06 NOTE — Telephone Encounter (Signed)
Letter written and available in mychart

## 2021-04-06 NOTE — Telephone Encounter (Signed)
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 She also needs it to state she was seen in our office on 02/03/22. Thank you

## 2021-04-06 NOTE — Telephone Encounter (Signed)
Letter re-entered for pt.  Confirmed pt received letter thru [place].  Charyl Bigger, CMA

## 2021-04-06 NOTE — Telephone Encounter (Signed)
Ok for work note? 

## 2021-04-06 NOTE — Telephone Encounter (Signed)
I called patient and made her aware.  

## 2021-04-19 ENCOUNTER — Other Ambulatory Visit: Payer: Self-pay | Admitting: Osteopathic Medicine

## 2021-04-19 NOTE — Telephone Encounter (Signed)
Routing to covering provider. [facility] requesting med refill for amlodipine. Written by historical provider.

## 2021-04-19 NOTE — Telephone Encounter (Signed)
Needs to re-establish care since alexander gone. Sent refill.

## 2021-04-19 NOTE — Telephone Encounter (Signed)
Please call the patient to schedule a TOC appointment. Thanks in advance.

## 2021-04-19 NOTE — Telephone Encounter (Signed)
Patient has a TOC appt 05/12/21 with Joy.

## 2021-04-20 ENCOUNTER — Ambulatory Visit: Payer: 59

## 2021-04-21 ENCOUNTER — Other Ambulatory Visit: Payer: Self-pay

## 2021-04-21 ENCOUNTER — Other Ambulatory Visit: Payer: Self-pay | Admitting: Family Medicine

## 2021-04-21 ENCOUNTER — Ambulatory Visit (INDEPENDENT_AMBULATORY_CARE_PROVIDER_SITE_OTHER): Payer: 59 | Admitting: Sports Medicine

## 2021-04-21 DIAGNOSIS — M25572 Pain in left ankle and joints of left foot: Secondary | ICD-10-CM

## 2021-04-21 DIAGNOSIS — M5416 Radiculopathy, lumbar region: Secondary | ICD-10-CM

## 2021-04-21 MED ORDER — PREDNISONE 50 MG PO TABS: ORAL_TABLET | ORAL | 0 refills | Status: DC

## 2021-04-21 MED ORDER — CYCLOBENZAPRINE HCL 10 MG PO TABS: 10.0000 mg | ORAL_TABLET | Freq: Two times a day (BID) | ORAL | 0 refills | Status: DC | PRN

## 2021-04-21 MED ORDER — IBUPROFEN 800 MG PO TABS: 800.0000 mg | ORAL_TABLET | Freq: Three times a day (TID) | ORAL | 0 refills | Status: DC | PRN

## 2021-04-21 NOTE — Progress Notes (Signed)
° ° °  Procedures performed today:    None.  Independent interpretation of notes and tests performed by another provider:   None.  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 She is having recurrence of discomfort not so much in the low back but more radicular pain down the left leg, she denies any red flag symptoms. We will start with a burst of prednisone, refilling Flexeril and ibuprofen for use after the prednisone finishes, I will also go ahead and order another left L5-S1 transforaminal epidural. Return to see me a month after the injection.    ___________________________________________ Gwen Her. Dianah Field, M.D., ABFM., CAQSM. Primary Care and [place] Instructor of [place] [employer] [facility]

## 2021-04-21 NOTE — Assessment & Plan Note (Signed)
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 She is having recurrence of discomfort not so much in the low back but more radicular pain down the left leg, she denies any red flag symptoms. We will start with a burst of prednisone, refilling Flexeril and ibuprofen for use after the prednisone finishes, I will also go ahead and order another left L5-S1 transforaminal epidural. Return to see me a month after the injection.

## 2021-05-09 ENCOUNTER — Ambulatory Visit
Admission: RE | Admit: 2021-05-09 | Discharge: 2021-05-09 | Disposition: A | Discharge status: Home / Self Care | Payer: 59 | Source: Ambulatory Visit | Attending: Sports Medicine | Admitting: Sports Medicine

## 2021-05-09 ENCOUNTER — Other Ambulatory Visit: Payer: Self-pay

## 2021-05-09 DIAGNOSIS — M5416 Radiculopathy, lumbar region: Secondary | ICD-10-CM

## 2021-05-09 MED ORDER — IOPAMIDOL (ISOVUE-M 200) INJECTION 41%
1.0000 mL | Freq: Once | INTRAMUSCULAR | Status: AC
  Administered 2021-05-09: 1 mL via EPIDURAL

## 2021-05-09 MED ORDER — METHYLPREDNISOLONE ACETATE 40 MG/ML INJ SUSP (RADIOLOG
80.0000 mg | Freq: Once | INTRAMUSCULAR | Status: AC
  Administered 2021-05-09: 80 mg via EPIDURAL

## 2021-05-09 NOTE — Discharge Instructions (Signed)

## 2021-05-10 IMAGING — MR MR LUMBAR SPINE W/O CM
4 of 5 series · 25 of 48 positions shown · non-contrast
Comparison: Radiographs of the lumbar spine 12/18/2018

CLINICAL DATA: Left lumbar radiculopathy. Left L4 radiculopathy,
slight weakness, failed conservative measures; radiculopathy, prior
surgery, new or progressive symptoms. Additional history provided:
Left buttock and left lateral leg pain down to mid lower leg off and
on for 10 months, patient initially injured back bowling 10 months
ago.

EXAM:
MRI LUMBAR SPINE WITHOUT CONTRAST
TECHNIQUE: Multiplanar, multisequence MR imaging of the lumbar spine was
performed. No intravenous contrast was administered.

[Series 2: T2 · sagittal · 4.0mm · 0.81mm/px · 6 of 15 slices shown (1 of 2)]
[im 1/15]
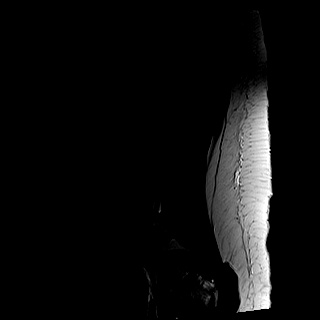
[im 3/15]
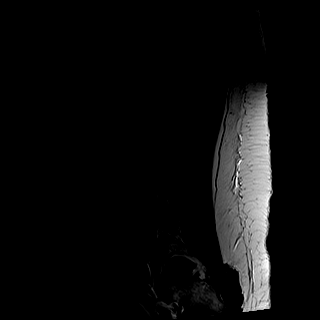
[im 6/15]
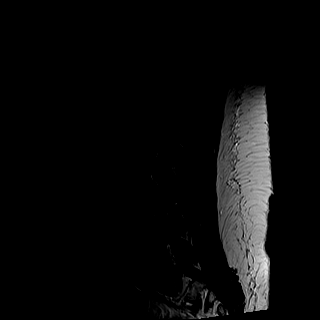
[im 9/15]
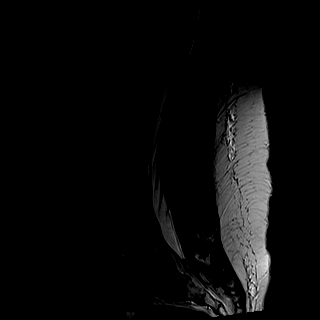
[im 12/15]
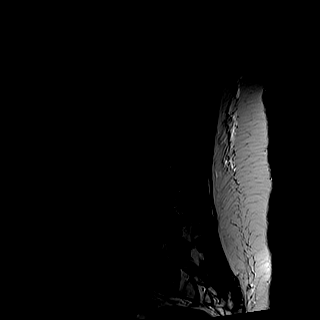
[im 15/15]
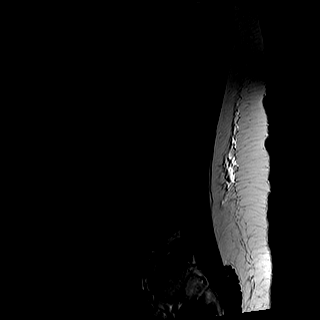

[Series 3: T1 · sagittal · 4.0mm · 0.41mm/px · 6 of 15 slices shown (1 of 2)]
[im 1/15]
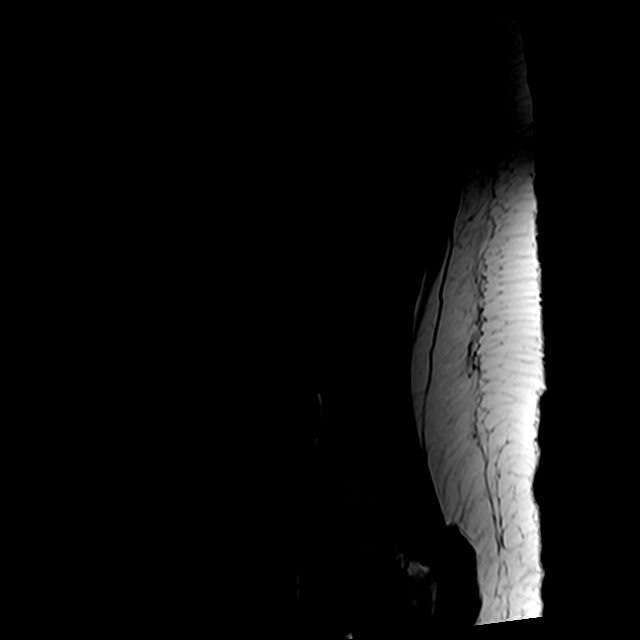
[im 3/15]
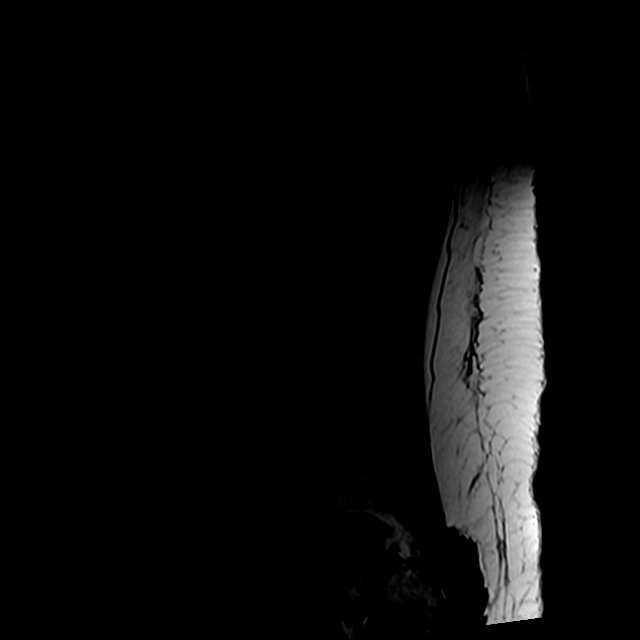
[im 6/15]
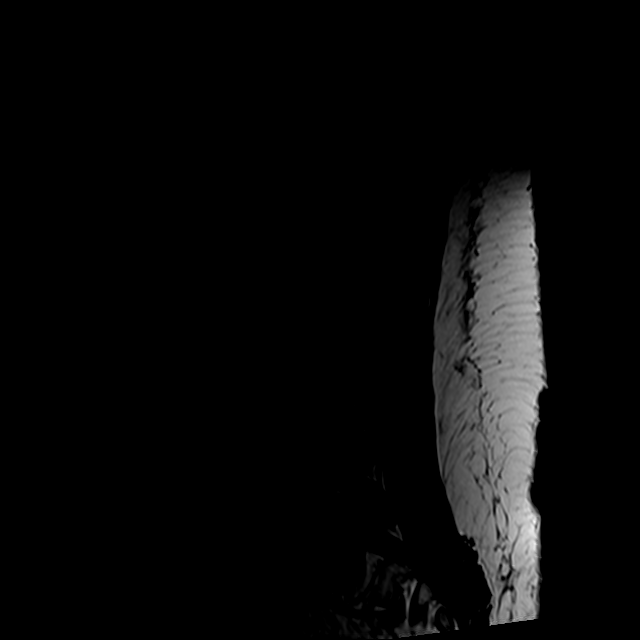
[im 9/15]
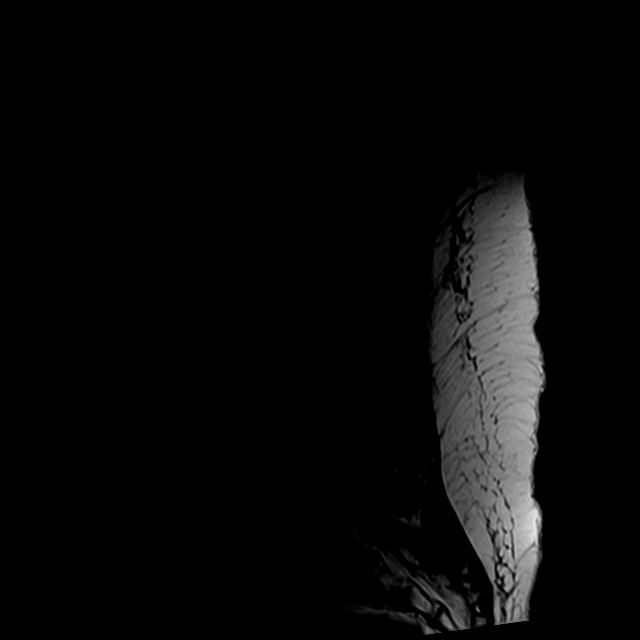
[im 12/15]
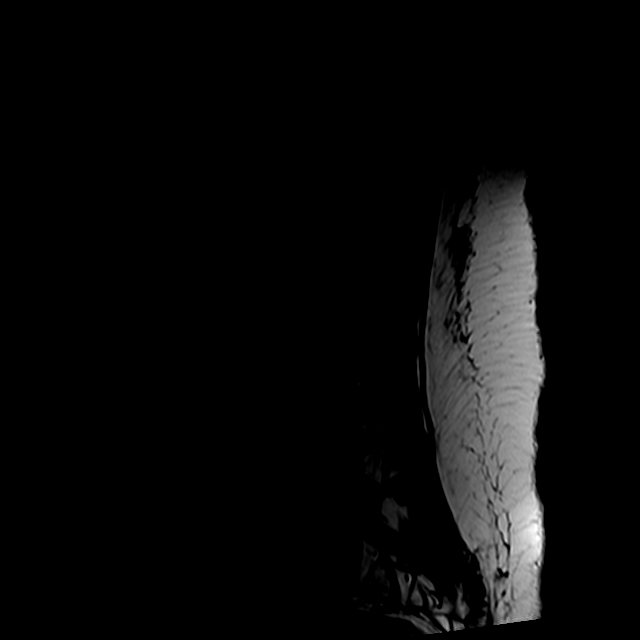
[im 15/15]
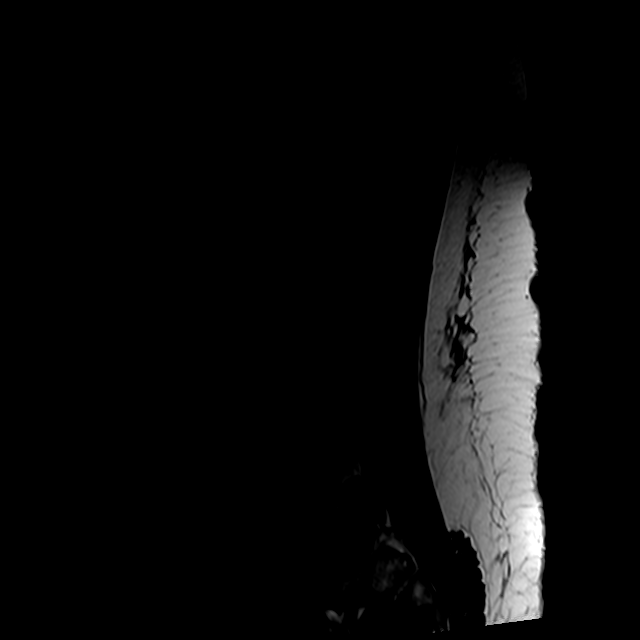

[Series 5: T2 · axial · 4.0mm · 0.78mm/px · z∈[-53,+149]mm · 9 of 38 slices shown (2 of 2)]
[im 1/38]
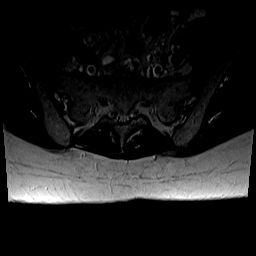
[im 6/38]
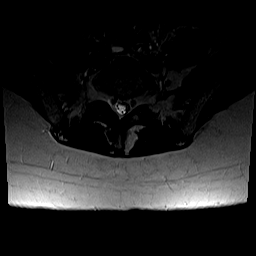
[im 11/38]
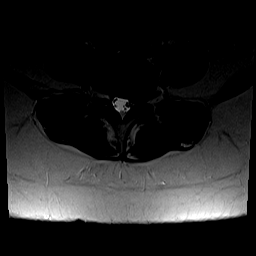
[im 16/38]
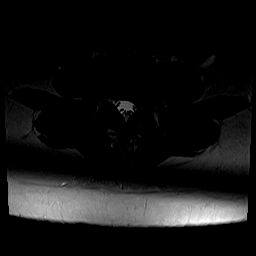
[im 19/38]
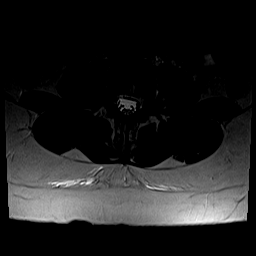
[im 22/38]
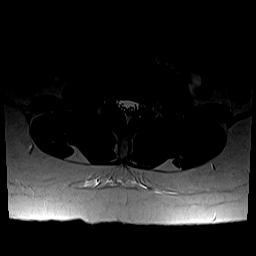
[im 27/38]
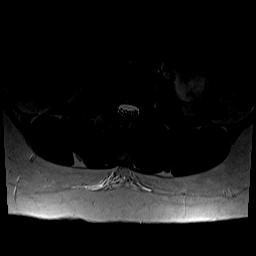
[im 32/38]
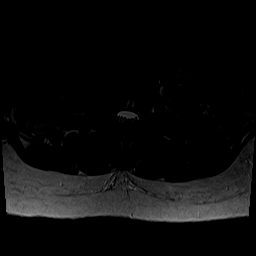
[im 38/38]
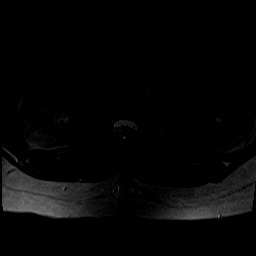

[Series 6: T1 · axial · 4.0mm · 0.39mm/px · z∈[-53,+119]mm · 4 of 38 slices shown (2 of 2)]
[im 1/38]
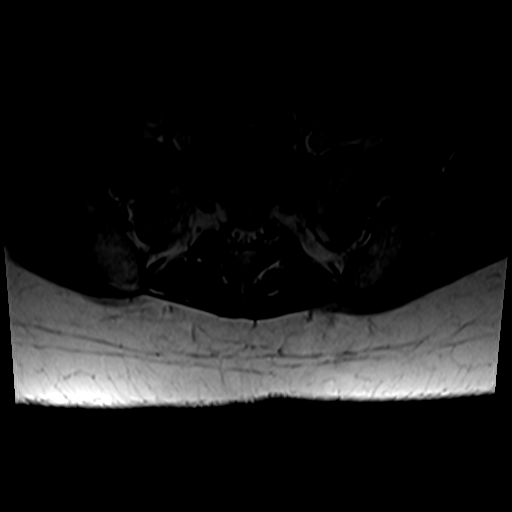
[im 6/38]
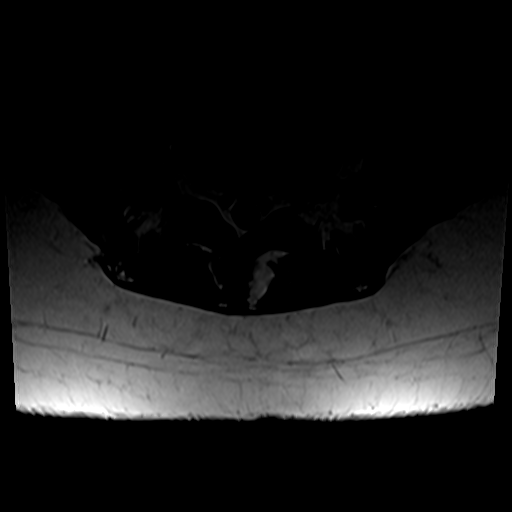
[im 19/38]
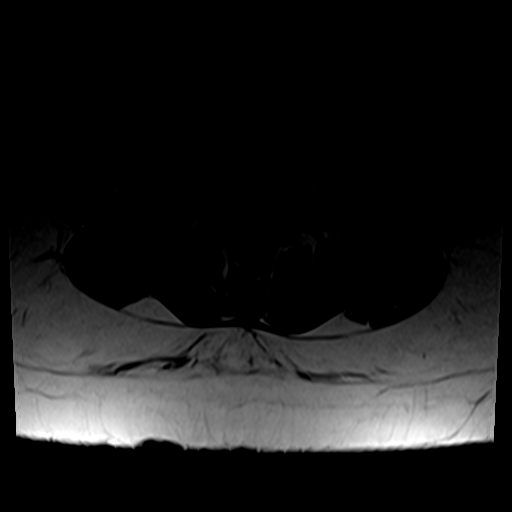
[im 32/38]
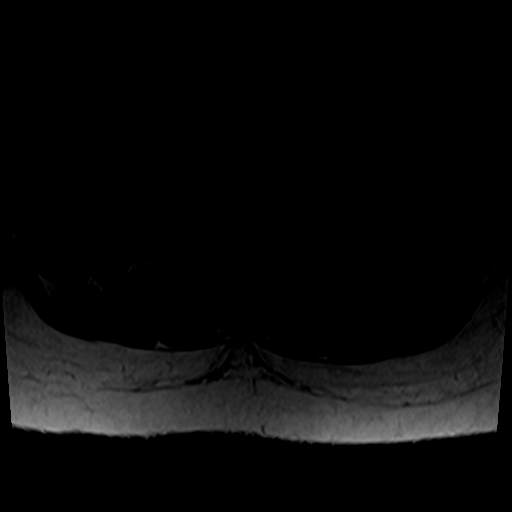

[25 of 48 positions shown; findings below may reference images not displayed]

FINDINGS: Segmentation: Correlating with prior lumbar spine radiographs, there
are 6 non rib-bearing lumbar type vertebral bodies. For the purposes
of this examination, the lowest well-formed intervertebral disc is
designated L5-S1.

Alignment:  No significant spondylolisthesis.

Vertebrae: Vertebral body height is maintained. No suspicious
osseous lesions

Conus medullaris and cauda equina: Conus extends to the T12-L1
level. No signal abnormality within the visualized distal spinal
cord.

Paraspinal and other soft tissues: Included portions of the abdomen
and paraspinal soft tissues unremarkable.

Disc levels:

Levels only mild disc degeneration.

No significant disc herniation, spinal canal or neural foraminal
narrowing at the T12-L1 through L2-L3 levels.

L3-L4: Broad-based left subarticular/foraminal disc protrusion. The
disc protrusion contributes to left subarticular narrowing with
encroachment upon the descending l \eft L4 nerve root. No
significant central canal stenosis. Mild left neural foraminal
narrowing.

L4-L5: Small left subarticular/foraminal disc protrusion. The disc
protrusion contributes to left subarticular narrowing with
encroachment upon the descending left L5 nerve root. No significant
central canal stenosis. Mild left neural foraminal narrowing.

L5-S1: No significant canal or foraminal stenosis.
IMPRESSION: Correlating with prior lumbar spine radiographs, there are 6 non
rib-bearing lumbar type vertebral bodies. For the purposes of this
dictation, the lowest well-formed intervertebral disc is designated
L5-S1. Careful attention to spinal numbering is recommended prior to
any intervention.

At L3-L4, broad-based left subarticular/foraminal disc protrusion.
Resultant left subarticular narrowing with encroachment upon the
descending left L4 nerve root. Mild left neural foraminal narrowing.

At L4-L5, small left subarticular/foraminal disc protrusion.
Resultant left subarticular narrowing with encroachment upon the
descending left L5 nerve root. Mild left neural foraminal narrowing.

## 2021-05-12 ENCOUNTER — Ambulatory Visit (INDEPENDENT_AMBULATORY_CARE_PROVIDER_SITE_OTHER): Payer: 59 | Admitting: Medical-Surgical

## 2021-05-12 ENCOUNTER — Other Ambulatory Visit: Payer: Self-pay

## 2021-05-12 ENCOUNTER — Encounter: Payer: Self-pay | Admitting: Medical-Surgical

## 2021-05-12 VITALS — BP 114/74 | HR 79 | Resp 20 | Ht 64.0 in | Wt 182.3 lb

## 2021-05-12 DIAGNOSIS — R635 Abnormal weight gain: Secondary | ICD-10-CM | POA: Diagnosis not present

## 2021-05-12 DIAGNOSIS — Z803 Family history of malignant neoplasm of breast: Secondary | ICD-10-CM

## 2021-05-12 DIAGNOSIS — R61 Generalized hyperhidrosis: Secondary | ICD-10-CM | POA: Diagnosis not present

## 2021-05-12 DIAGNOSIS — Z789 Other specified health status: Secondary | ICD-10-CM

## 2021-05-12 DIAGNOSIS — Z113 Encounter for screening for infections with a predominantly sexual mode of transmission: Secondary | ICD-10-CM

## 2021-05-12 DIAGNOSIS — Z7689 Persons encountering health services in other specified circumstances: Secondary | ICD-10-CM

## 2021-05-12 DIAGNOSIS — I10 Essential (primary) hypertension: Secondary | ICD-10-CM

## 2021-05-12 DIAGNOSIS — Z131 Encounter for screening for diabetes mellitus: Secondary | ICD-10-CM

## 2021-05-12 DIAGNOSIS — L608 Other nail disorders: Secondary | ICD-10-CM

## 2021-05-12 DIAGNOSIS — E559 Vitamin D deficiency, unspecified: Secondary | ICD-10-CM

## 2021-05-12 DIAGNOSIS — R1013 Epigastric pain: Secondary | ICD-10-CM

## 2021-05-12 DIAGNOSIS — N6315 Unspecified lump in the right breast, overlapping quadrants: Secondary | ICD-10-CM

## 2021-05-12 MED ORDER — TRIAMCINOLONE ACETONIDE 0.1 % EX OINT: 1.0000 "application " | TOPICAL_OINTMENT | Freq: Two times a day (BID) | CUTANEOUS | 6 refills | Status: DC

## 2021-05-12 MED ORDER — PANTOPRAZOLE SODIUM 40 MG PO TBEC: 40.0000 mg | DELAYED_RELEASE_TABLET | Freq: Every day | ORAL | 1 refills | Status: DC

## 2021-05-12 MED ORDER — PHENTERMINE HCL 37.5 MG PO CAPS: 37.5000 mg | ORAL_CAPSULE | ORAL | 0 refills | Status: DC

## 2021-05-12 NOTE — Progress Notes (Signed)
HPI with pertinent ROS:   CC: Transfer care  HPI: Pleasant 46 year old female presenting today to transfer care to a new PCP.  She comes in with a long list of concerns today and reports quite a few symptoms that she is experiencing.  Has a list of laboratory testing that she would like completed.  Notable for hot flashes, night sweats, brain fog, and weight gain.  Also has difficulty with sleep but this is a chronic issue.  Admits that she does like sweets and finds herself eating out of boredom.  She does have significant issues with her back and associated sciatica which limits her activity level.  When she cannot exercise, she finds herself eating instead.  Is taking ibuprofen 800 mg on an as-needed basis for sciatica and has recently begun to experience some epigastric pain along with acid reflux after drinking water to wash down food.  At times she is nauseated and other times she has an epigastric burning.  She does have a history of eczema mostly affecting her right forearm.  She had a cream years ago but is out of it now and would like to have have it represcribed since her eczema is flaring.  About a week ago, noted a small lump at the 12 o'clock position of the right breast.  This lump is not painful but she is very concerned since her mother had breast cancer.  I reviewed the past medical history, family history, social history, surgical history, and allergies today and no changes were needed.  Please see the problem list section below in epic for further details.   Physical exam:   General: Well Developed, well nourished, and in no acute distress.  Neuro: Alert and oriented x3.  HEENT: Normocephalic, atraumatic.  Skin: Warm and dry. Cardiac: Regular rate and rhythm, no murmurs rubs or gallops, no lower extremity edema.  Respiratory: Clear to auscultation bilaterally. Not using accessory muscles, speaking in full sentences.  Impression and Recommendations:    1. Encounter to  establish care Reviewed available information and discussed care concerns with patient.   2. Hypertension goal BP (blood pressure) < 130/80 Checking labs today.  Blood pressure at goal.  Continue amlodipine 10 mg daily. - CBC with Differential - COMPLETE METABOLIC PANEL WITH GFR - Lipid panel - Urinalysis, Routine w reflex microscopic  3. Weight gain Checking lab work as below.  Continue vegan diet.  Discussed various options to help with weight loss.  She has done phentermine in the past would like to repeat this.  Advised that we can do this for 3 months but no longer and she is agreeable.  Sending in phentermine 37.5 mg tablets with instructions to take a half tab for the first week and move up in dosage if desired. - Thyroid Panel With TSH - Estradiol - FSH/LH - Cortisol - Insulin, random  4. Night sweats Checking labs as below.  Advised this is likely related to the aging process as well as her more recent hysterectomy. - Estradiol - FSH/LH  5. Diabetes mellitus screening Checking hemoglobin A1c. - Hemoglobin A1c  6. Family history of breast cancer 7. Breast lump on right side at 12 o'clock position Ordering diagnostic mammogram for evaluation of the right breast lump.  Checking CA125 per patient request - CA 125 - MM DIAG BREAST TOMO BILATERAL; Future  8. Epigastric pain Checking labs as below.  Suspect this is more of an acid reflux issue.  Avoid food triggers.  Starting Protonix 40 mg  daily for the next 8 weeks then plan to taper off.  Okay to use famotidine 20 mg twice daily for breakthrough symptoms. - Amylase - Lipase - Sed Rate (ESR)  9. Routine screening for STI (sexually transmitted infection) Checking HIV and RPR per patient request. - HIV Antibody (routine testing w rflx) - RPR  10. Vitamin D deficiency Checking vitamin D. - VITAMIN D 25 Hydroxy (Vit-D Deficiency, Fractures)  11. Vegan diet Checking vitamin B12. - Vitamin B12  12. Deformity of  toenail Referring to podiatry per patient request. - Ambulatory referral to Podiatry  Return in about 4 weeks (around 06/09/2021) for weight check. ___________________________________________ Clearnce Sorrel, DNP, APRN, FNP-BC Primary Care and [place]

## 2021-05-13 LAB — INSULIN, RANDOM: Insulin: 15.4 u[IU]/mL

## 2021-05-13 LAB — AMYLASE: Amylase: 108 U/L — ABNORMAL HIGH (ref 21–101)

## 2021-05-13 LAB — CBC WITH DIFFERENTIAL/PLATELET
Absolute Monocytes: 518 cells/uL (ref 200–950)
Basophils Absolute: 71 cells/uL (ref 0–200)
Basophils Relative: 1 %
Eosinophils Absolute: 92 cells/uL (ref 15–500)
Eosinophils Relative: 1.3 %
HCT: 38.8 % (ref 35.0–45.0)
Hemoglobin: 13 g/dL (ref 11.7–15.5)
Lymphs Abs: 1796 cells/uL (ref 850–3900)
MCH: 31.9 pg (ref 27.0–33.0)
MCHC: 33.5 g/dL (ref 32.0–36.0)
MCV: 95.1 fL (ref 80.0–100.0)
MPV: 10.4 fL (ref 7.5–12.5)
Monocytes Relative: 7.3 %
Neutro Abs: 4622 cells/uL (ref 1500–7800)
Neutrophils Relative %: 65.1 %
Platelets: 341 10*3/uL (ref 140–400)
RBC: 4.08 10*6/uL (ref 3.80–5.10)
RDW: 12.4 % (ref 11.0–15.0)
Total Lymphocyte: 25.3 %
WBC: 7.1 10*3/uL (ref 3.8–10.8)

## 2021-05-13 LAB — CA 125: CA 125: 32 U/mL (ref <35)

## 2021-05-13 LAB — URINALYSIS, ROUTINE W REFLEX MICROSCOPIC
Bilirubin Urine: NEGATIVE
Glucose, UA: NEGATIVE
Hgb urine dipstick: NEGATIVE
Ketones, ur: NEGATIVE
Leukocytes,Ua: NEGATIVE
Nitrite: NEGATIVE
Protein, ur: NEGATIVE
Specific Gravity, Urine: 1.004 (ref 1.001–1.035)
pH: 7 (ref 5.0–8.0)

## 2021-05-13 LAB — COMPLETE METABOLIC PANEL WITH GFR
AG Ratio: 1.7 (calc) (ref 1.0–2.5)
ALT: 12 U/L (ref 6–29)
AST: 17 U/L (ref 10–30)
Albumin: 4.7 g/dL (ref 3.6–5.1)
Alkaline phosphatase (APISO): 90 U/L (ref 31–125)
BUN/Creatinine Ratio: 9 (calc) (ref 6–22)
BUN: 6 mg/dL — ABNORMAL LOW (ref 7–25)
CO2: 26 mmol/L (ref 20–32)
Calcium: 10.6 mg/dL — ABNORMAL HIGH (ref 8.6–10.2)
Chloride: 103 mmol/L (ref 98–110)
Creat: 0.64 mg/dL (ref 0.50–0.99)
Globulin: 2.7 g/dL (calc) (ref 1.9–3.7)
Glucose, Bld: 89 mg/dL (ref 65–139)
Potassium: 4.4 mmol/L (ref 3.5–5.3)
Sodium: 138 mmol/L (ref 135–146)
Total Bilirubin: 0.5 mg/dL (ref 0.2–1.2)
Total Protein: 7.4 g/dL (ref 6.1–8.1)
eGFR: 112 mL/min/{1.73_m2} (ref >=60)

## 2021-05-13 LAB — HIV ANTIBODY (ROUTINE TESTING W REFLEX): HIV 1&2 Ab, 4th Generation: NONREACTIVE

## 2021-05-13 LAB — LIPID PANEL
Cholesterol: 240 mg/dL — ABNORMAL HIGH (ref <200)
HDL: 81 mg/dL (ref >=50)
LDL Cholesterol (Calc): 145 mg/dL (calc) — ABNORMAL HIGH
Non-HDL Cholesterol (Calc): 159 mg/dL (calc) — ABNORMAL HIGH (ref <130)
Total CHOL/HDL Ratio: 3 (calc) (ref <5.0)
Triglycerides: 51 mg/dL (ref <150)

## 2021-05-13 LAB — VITAMIN D 25 HYDROXY (VIT D DEFICIENCY, FRACTURES): Vit D, 25-Hydroxy: 37 ng/mL (ref 30–100)

## 2021-05-13 LAB — THYROID PANEL WITH TSH
Free Thyroxine Index: 2.2 (ref 1.4–3.8)
T3 Uptake: 23 % (ref 22–35)
T4, Total: 9.4 ug/dL (ref 5.1–11.9)
TSH: 0.72 mIU/L

## 2021-05-13 LAB — HEMOGLOBIN A1C
Hgb A1c MFr Bld: 5.1 % of total Hgb (ref <5.7)
Mean Plasma Glucose: 100 mg/dL
eAG (mmol/L): 5.5 mmol/L

## 2021-05-13 LAB — SEDIMENTATION RATE: Sed Rate: 11 mm/h (ref 0–20)

## 2021-05-13 LAB — FSH/LH
FSH: 9.5 m[IU]/mL
LH: 3.2 m[IU]/mL

## 2021-05-13 LAB — RPR: RPR Ser Ql: NONREACTIVE

## 2021-05-13 LAB — VITAMIN B12: Vitamin B-12: 251 pg/mL (ref 200–1100)

## 2021-05-13 LAB — CORTISOL: Cortisol, Plasma: 2.1 ug/dL — ABNORMAL LOW

## 2021-05-13 LAB — LIPASE: Lipase: 9 U/L (ref 7–60)

## 2021-05-13 LAB — ESTRADIOL: Estradiol: 130 pg/mL

## 2021-05-20 ENCOUNTER — Other Ambulatory Visit: Payer: Self-pay

## 2021-05-20 ENCOUNTER — Ambulatory Visit (INDEPENDENT_AMBULATORY_CARE_PROVIDER_SITE_OTHER): Payer: 59 | Admitting: Sports Medicine

## 2021-05-20 DIAGNOSIS — M25572 Pain in left ankle and joints of left foot: Secondary | ICD-10-CM | POA: Diagnosis not present

## 2021-05-20 DIAGNOSIS — M5416 Radiculopathy, lumbar region: Secondary | ICD-10-CM

## 2021-05-20 DIAGNOSIS — N62 Hypertrophy of breast: Secondary | ICD-10-CM

## 2021-05-20 MED ORDER — CYCLOBENZAPRINE HCL 10 MG PO TABS: 10.0000 mg | ORAL_TABLET | Freq: Every day | ORAL | 3 refills | Status: DC

## 2021-05-20 MED ORDER — MELOXICAM 15 MG PO TABS: ORAL_TABLET | ORAL | 3 refills | Status: DC

## 2021-05-20 MED ORDER — CLOTRIMAZOLE-BETAMETHASONE 1-0.05 % EX CREA: 1.0000 "application " | TOPICAL_CREAM | Freq: Two times a day (BID) | CUTANEOUS | 11 refills | Status: DC

## 2021-05-20 NOTE — Assessment & Plan Note (Signed)
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 ?She has significant neck and mid back pain. ?At this point she is interested in undergoing breast reduction, referral to [facility] plastic surgery, further management per primary care provider. ?

## 2021-05-20 NOTE — Progress Notes (Signed)
? ? ?  Procedures performed today:   ? ?None. ? ?Independent interpretation of notes and tests performed by another provider:  ? ?None. ? ?Brief History, Exam, Impression, and Recommendations:   ? 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 ?She does still endorse some discomfort right low back and lateral hip when laying prone at night. ?She will work on avoiding sleeping prone, I will also have her do meloxicam with dinner, Flexeril, and we can return to reevaluate this in a month if needed. ? 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 ?She has significant neck and mid back pain. ?At this point she is interested in undergoing breast reduction, referral to [facility] plastic surgery, further management per primary care provider. ? ? ? ?___________________________________________ ?Gwen Her. Dianah Field, M.D., ABFM., CAQSM. ?Primary Care and Sports Medicine ?[place] ? ?Adjunct Instructor of Family Medicine  ?[employer] ?

## 2021-05-20 NOTE — Assessment & Plan Note (Signed)
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 ?She does still endorse some discomfort right low back and lateral hip when laying prone at night. ?She will work on avoiding sleeping prone, I will also have her do meloxicam with dinner, Flexeril, and we can return to reevaluate this in a month if needed. ?

## 2021-06-03 ENCOUNTER — Other Ambulatory Visit: Payer: Self-pay | Admitting: Medical-Surgical

## 2021-06-08 ENCOUNTER — Other Ambulatory Visit: Payer: Self-pay | Admitting: Medical-Surgical

## 2021-06-08 ENCOUNTER — Encounter: Payer: Self-pay | Admitting: Medical-Surgical

## 2021-06-08 DIAGNOSIS — N6315 Unspecified lump in the right breast, overlapping quadrants: Secondary | ICD-10-CM

## 2021-06-08 DIAGNOSIS — Z803 Family history of malignant neoplasm of breast: Secondary | ICD-10-CM

## 2021-06-16 ENCOUNTER — Ambulatory Visit (INDEPENDENT_AMBULATORY_CARE_PROVIDER_SITE_OTHER): Payer: 59 | Admitting: Medical-Surgical

## 2021-06-16 ENCOUNTER — Encounter: Payer: Self-pay | Admitting: Medical-Surgical

## 2021-06-16 VITALS — BP 121/77 | HR 97 | Resp 16 | Ht 64.0 in | Wt 175.0 lb

## 2021-06-16 DIAGNOSIS — R635 Abnormal weight gain: Secondary | ICD-10-CM | POA: Diagnosis not present

## 2021-06-16 MED ORDER — PHENTERMINE HCL 37.5 MG PO TABS: ORAL_TABLET | ORAL | 0 refills | Status: DC

## 2021-06-16 NOTE — Progress Notes (Signed)
?  HPI with pertinent ROS:  ? ?CC: Weight check ? ?HPI: ?Pleasant 46 year old female presenting today for weight check on phentermine 37.5 mg daily.  She has been tolerating the medication well and has not had any side effects.  She has been staying busy and recently went on a trip with her husband to [place] in [place] where they were very active overall.  They even did an excursion into the caves that required significant climbing and hiking.  Feels that the medicine has been working well for her and would like to do 1 more month before stopping it.  Feels that she just needed something to get her over the hump and get her started on keeping her weight to a reasonable level. ? ?I reviewed the past medical history, family history, social history, surgical history, and allergies today and no changes were needed.  Please see the problem list section below in epic for further details. ? ? ?Physical exam:  ? ?General: Well Developed, well nourished, and in no acute distress.  ?Neuro: Alert and oriented x3.  ?HEENT: Normocephalic, atraumatic.  ?Skin: Warm and dry. ?Cardiac: Regular rate and rhythm.  ?Respiratory: Not using accessory muscles, speaking in full sentences. ? ?Impression and Recommendations:   ? ?1. Weight gain ?She has lost approximately 8-1/2 pounds in the last 4 weeks.  Continue regular intentional activity and dietary modification.  We will do 1 more month of phentermine 37.5 mg daily then discontinue the medication. ? ?Return if symptoms worsen or fail to improve. ?___________________________________________ ?Clearnce Sorrel, DNP, APRN, FNP-BC ?Primary Care and Sports Medicine ?[place] ?

## 2021-07-01 ENCOUNTER — Ambulatory Visit
Admission: RE | Admit: 2021-07-01 | Discharge: 2021-07-01 | Disposition: A | Discharge status: Home / Self Care | Payer: 59 | Source: Ambulatory Visit | Attending: Medical-Surgical | Admitting: Medical-Surgical

## 2021-07-01 ENCOUNTER — Other Ambulatory Visit: Payer: Self-pay | Admitting: Physician Assistant

## 2021-07-01 DIAGNOSIS — R519 Headache, unspecified: Secondary | ICD-10-CM

## 2021-07-01 DIAGNOSIS — Z803 Family history of malignant neoplasm of breast: Secondary | ICD-10-CM

## 2021-07-01 DIAGNOSIS — N6315 Unspecified lump in the right breast, overlapping quadrants: Secondary | ICD-10-CM

## 2021-07-12 ENCOUNTER — Other Ambulatory Visit: Payer: Self-pay | Admitting: Physician Assistant

## 2021-09-16 ENCOUNTER — Other Ambulatory Visit: Payer: Self-pay | Admitting: Medical-Surgical

## 2021-10-20 ENCOUNTER — Other Ambulatory Visit: Payer: Self-pay | Admitting: Family Medicine

## 2021-10-20 ENCOUNTER — Other Ambulatory Visit: Payer: Self-pay | Admitting: Medical-Surgical

## 2021-12-05 ENCOUNTER — Encounter: Payer: Self-pay | Admitting: Physician Assistant

## 2021-12-05 ENCOUNTER — Ambulatory Visit (INDEPENDENT_AMBULATORY_CARE_PROVIDER_SITE_OTHER): Payer: 59 | Admitting: Physician Assistant

## 2021-12-05 VITALS — BP 140/91 | HR 122 | Temp 98.7°F | Ht 64.0 in | Wt 170.0 lb

## 2021-12-05 DIAGNOSIS — J029 Acute pharyngitis, unspecified: Secondary | ICD-10-CM | POA: Diagnosis not present

## 2021-12-05 DIAGNOSIS — R051 Acute cough: Secondary | ICD-10-CM | POA: Diagnosis not present

## 2021-12-05 DIAGNOSIS — J069 Acute upper respiratory infection, unspecified: Secondary | ICD-10-CM

## 2021-12-05 LAB — POCT INFLUENZA A/B
Influenza A, POC: NEGATIVE
Influenza B, POC: NEGATIVE

## 2021-12-05 LAB — POC COVID19 BINAXNOW: SARS Coronavirus 2 Ag: NEGATIVE

## 2021-12-05 LAB — POCT RAPID STREP A (OFFICE): Rapid Strep A Screen: NEGATIVE

## 2021-12-05 MED ORDER — IBUPROFEN 800 MG PO TABS: 800.0000 mg | ORAL_TABLET | Freq: Three times a day (TID) | ORAL | 1 refills | Status: DC | PRN

## 2021-12-05 MED ORDER — HYDROCOD POLI-CHLORPHE POLI ER 10-8 MG/5ML PO SUER: 5.0000 mL | Freq: Two times a day (BID) | ORAL | 0 refills | Status: DC | PRN

## 2021-12-05 MED ORDER — DEXAMETHASONE 4 MG PO TABS: 4.0000 mg | ORAL_TABLET | Freq: Two times a day (BID) | ORAL | 0 refills | Status: DC

## 2021-12-05 MED ORDER — VITAMIN D (ERGOCALCIFEROL) 1.25 MG (50000 UNIT) PO CAPS: 50000.0000 [IU] | ORAL_CAPSULE | ORAL | 1 refills | Status: DC

## 2021-12-05 NOTE — Patient Instructions (Signed)

## 2021-12-05 NOTE — Progress Notes (Unsigned)
Acute Office Visit  Subjective:     Patient ID: Marissa Chaney, female    DOB: 1975/09/18, 46 y.o.   MRN: [id number]  Chief Complaint  Patient presents with   Cough    HPI Pt is a 46 y/o F presenting to clinic with sore throat onset 2 days ago and now a nonproductive cough and nasal congestion/rhinorrhea with post nasal drip. She also complains of body aches that began 1 week ago. Yesterday she had some chills and the right side of her face hurt. Denies SOB. She has been taking Advil, elderberry tea, and cough drops for symptomatic relief. Pt's daughter with asthma has had a cough for 2 weeks who has tested negative for covid & RSV this AM.    ROS See HPI      Objective:    BP (!) 140/91   Pulse (!) 122   Temp 98.7 F (37.1 C) (Oral)   Ht '5\' 4"'$  (1.626 m)   Wt 77.1 kg   LMP 11/19/2018   SpO2 100%   BMI 29.18 kg/m  BP Readings from Last 3 Encounters:  12/05/21 (!) 140/91  06/16/21 121/77  05/12/21 114/74   SpO2 Readings from Last 3 Encounters:  12/05/21 100%  06/16/21 99%  05/12/21 100%    Physical Exam HENT:     Head: Normocephalic and atraumatic.     Right Ear: Tympanic membrane, ear canal and external ear normal.     Left Ear: Tympanic membrane, ear canal and external ear normal.     Nose: Congestion and rhinorrhea present.     Mouth/Throat:     Pharynx: Oropharynx is clear. Posterior oropharyngeal erythema present. No oropharyngeal exudate.  Eyes:     Conjunctiva/sclera: Conjunctivae normal.  Cardiovascular:     Rate and Rhythm: Regular rhythm. Tachycardia present.     Pulses: Normal pulses.     Heart sounds: Normal heart sounds.  Pulmonary:     Effort: Pulmonary effort is normal.     Breath sounds: Normal breath sounds.  Musculoskeletal:     Cervical back: No tenderness.  Lymphadenopathy:     Cervical: No cervical adenopathy.  Neurological:     Mental Status: She is alert and oriented to person, place, and time.  Psychiatric:        Mood and  Affect: Mood normal.     No results found for any visits on 12/05/21.  .. Results for orders placed or performed in visit on 12/05/21  POC COVID-19  Result Value Ref Range   SARS Coronavirus 2 Ag Negative Negative  POCT rapid strep A  Result Value Ref Range   Rapid Strep A Screen Negative Negative  POCT Influenza A/B  Result Value Ref Range   Influenza A, POC Negative Negative   Influenza B, POC Negative Negative       Assessment & Plan:   Problem List Items Addressed This Visit   None Visit Diagnoses     Acute cough    -  Primary   Relevant Orders   POC COVID-19   POCT rapid strep A   POCT Influenza A/B   Sore throat       Relevant Orders   POC COVID-19   POCT rapid strep A   POCT Influenza A/B        .Marland KitchenJanesha was seen today for cough.  Diagnoses and all orders for this visit:  Viral upper respiratory tract infection  Acute cough -     POC COVID-19 -  POCT rapid strep A -     POCT Influenza A/B  Sore throat -     POC COVID-19 -     POCT rapid strep A -     POCT Influenza A/B  Other orders -     chlorpheniramine-HYDROcodone (TUSSIONEX) 10-8 MG/5ML; Take 5 mLs by mouth every 12 (twelve) hours as needed for cough (cough, will cause drowsiness.). -     dexamethasone (DECADRON) 4 MG tablet; Take 1 tablet (4 mg total) by mouth 2 (two) times daily with a meal. -     Vitamin D, Ergocalciferol, (DRISDOL) 1.25 MG (50000 UNIT) CAPS capsule; Take 1 capsule (50,000 Units total) by mouth every 7 (seven) days. -     ibuprofen (ADVIL) 800 MG tablet; Take 1 tablet (800 mg total) by mouth every 8 (eight) hours as needed.     Lorenso Quarry, Student-PA

## 2021-12-05 NOTE — Progress Notes (Deleted)
   Acute Office Visit  Subjective:     Patient ID: Marissa Chaney, female    DOB: Dec 05, 1975, 46 y.o.   MRN: [id number]  Chief Complaint  Patient presents with   Cough    HPI Patient is in today for ***  ROS      Objective:    BP (!) 140/91   Pulse (!) 122   Temp 98.7 F (37.1 C) (Oral)   Ht '5\' 4"'$  (1.626 m)   Wt 170 lb (77.1 kg)   LMP 11/19/2018   SpO2 100%   BMI 29.18 kg/m  {Vitals History (Optional):23777}  Physical Exam  No results found for any visits on 12/05/21.      Assessment & Plan:   Problem List Items Addressed This Visit   None Visit Diagnoses     Acute cough    -  Primary   Relevant Orders   POC COVID-19   POCT rapid strep A   POCT Influenza A/B   Sore throat       Relevant Orders   POC COVID-19   POCT rapid strep A   POCT Influenza A/B       No orders of the defined types were placed in this encounter.   No follow-ups on file.  Iran Planas, PA-C

## 2021-12-07 ENCOUNTER — Telehealth: Payer: Self-pay

## 2021-12-07 NOTE — Telephone Encounter (Signed)
Pt came by to ask for another doctor's not for today. She would like for the note to state that she can go back to work tomorrow. She just got the Rx that was prescribed to her last night and it kept her up all night. Pt also stated that she would be looking for that in Mychart. Tvt

## 2021-12-07 NOTE — Telephone Encounter (Signed)
Note written and available through mychart.

## 2022-01-16 LAB — HEPATIC FUNCTION PANEL
ALT: 23 U/L (ref 7–35)
AST: 23 (ref 13–35)
Alkaline Phosphatase: 93 (ref 25–125)
Bilirubin, Direct: 0.1
Bilirubin, Total: 0.3

## 2022-01-16 LAB — CBC AND DIFFERENTIAL
HCT: 40 (ref 36–46)
Hemoglobin: 13.4 (ref 12.0–16.0)
Neutrophils Absolute: 3781
Platelets: 374 10*3/uL (ref 150–400)
WBC: 6.9

## 2022-01-16 LAB — BASIC METABOLIC PANEL
Creatinine: 0.7 (ref 0.5–1.1)
Glucose: 74

## 2022-01-16 LAB — LIPID PANEL
Cholesterol: 229 — AB (ref 0–200)
HDL: 65 (ref 35–70)
LDL Cholesterol: 147
LDl/HDL Ratio: 3.5
Triglycerides: 71 (ref 40–160)

## 2022-01-16 LAB — IRON,TIBC AND FERRITIN PANEL
%SAT: 19
Ferritin: 18
Iron: 73
TIBC: 376

## 2022-01-16 LAB — COMPREHENSIVE METABOLIC PANEL
Albumin: 4 (ref 3.5–5.0)
Calcium: 9.4 (ref 8.7–10.7)

## 2022-01-16 LAB — HEMOGLOBIN A1C: Hemoglobin A1C: 5.3

## 2022-01-16 LAB — TSH: TSH: 1.87 (ref 0.41–5.90)

## 2022-01-16 LAB — VITAMIN D 25 HYDROXY (VIT D DEFICIENCY, FRACTURES): Vit D, 25-Hydroxy: 26

## 2022-01-16 LAB — CBC: RBC: 4.17 (ref 3.87–5.11)

## 2022-01-21 ENCOUNTER — Other Ambulatory Visit: Payer: Self-pay | Admitting: Medical-Surgical

## 2022-02-02 ENCOUNTER — Encounter: Payer: Self-pay | Admitting: Medical-Surgical

## 2022-03-21 ENCOUNTER — Ambulatory Visit
Admission: EM | Admit: 2022-03-21 | Discharge: 2022-03-21 | Disposition: A | Discharge status: Home / Self Care | Payer: 59 | Attending: Family Medicine | Admitting: Family Medicine

## 2022-03-21 DIAGNOSIS — L089 Local infection of the skin and subcutaneous tissue, unspecified: Secondary | ICD-10-CM | POA: Diagnosis not present

## 2022-03-21 DIAGNOSIS — L720 Epidermal cyst: Secondary | ICD-10-CM | POA: Diagnosis not present

## 2022-03-21 DIAGNOSIS — L0291 Cutaneous abscess, unspecified: Secondary | ICD-10-CM

## 2022-03-21 MED ORDER — DOXYCYCLINE HYCLATE 100 MG PO CAPS: 100.0000 mg | ORAL_CAPSULE | Freq: Two times a day (BID) | ORAL | 0 refills | Status: DC

## 2022-03-21 NOTE — ED Triage Notes (Signed)
Pt presents with c/o abscess on her back.

## 2022-03-21 NOTE — Discharge Instructions (Addendum)
Keep the bandage on as long as it is draining Change dressing daily Take the antibiotic twice a day with food See your doctor if not improved in 48 to 72 hours Small piece of gauze opening the wound should fall out in a day or 2.

## 2022-03-26 DIAGNOSIS — L089 Local infection of the skin and subcutaneous tissue, unspecified: Secondary | ICD-10-CM

## 2022-03-26 DIAGNOSIS — L720 Epidermal cyst: Secondary | ICD-10-CM

## 2022-03-26 NOTE — ED Provider Notes (Signed)
[facility]    CSN: [id number] Arrival date & time: 03/21/22  1336      History   Chief Complaint Chief Complaint  Patient presents with   Abscess    HPI Marissa Chaney is a 47 y.o. female.   HPI patient has an abscess on the middle of her back.  Is been slowly growing for about a week.  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   Sinus headache 04/05/2021   Post-operative state 12/10/2018   Cervical polyp 05/31/2018   Uterine leiomyoma 05/31/2018   Myofascial pain 67/89/3810   Lichen simplex chronicus 05/01/2018   Chronic fatigue 05/01/2018   Hypercalcemia 04/29/2018   Iron deficiency anemia 04/29/2018   Normocytic anemia 04/26/2018   Vitamin D deficiency 04/26/2018   Left lumbar radiculopathy 04/26/2018   Intermenstrual bleeding 04/26/2018   Hypertension goal BP (blood pressure) < 130/80 04/26/2018   Tinea pedis of both feet 07/22/2015   Sarcoidosis 09/09/2014   Uveitis 06/03/2014    Past Surgical History:  Procedure Laterality Date   NO PAST SURGERIES     VAGINAL HYSTERECTOMY Bilateral 12/10/2018   Procedure: HYSTERECTOMY VAGINAL WITH SALPINGECTOMY;  Surgeon: Emily Filbert, MD;  Location: [facility];  Service: Gynecology;  Laterality: Bilateral;    OB History     Gravida  5   Para  2   Term      Preterm      AB  3   Living  2      SAB  1   IAB  2   Ectopic      Multiple      Live Births               Home Medications    Prior to Admission medications   Medication Sig Start Date End Date Taking? Authorizing Provider  doxycycline (VIBRAMYCIN) 100 MG capsule Take 1 capsule (100 mg total) by mouth 2 (two) times daily. 03/21/22  Yes Raylene Everts, MD  amLODipine (NORVASC) 10 MG tablet TAKE 1 TABLET BY MOUTH EVERY DAY 01/23/22    Samuel Bouche, NP  clotrimazole-betamethasone (LOTRISONE) cream Apply 1 application topically 2 (two) times daily. 05/20/21   Silverio Decamp, MD  cyclobenzaprine (FLEXERIL) 10 MG tablet Take 1 tablet (10 mg total) by mouth at bedtime. 05/20/21   Silverio Decamp, MD  ibuprofen (ADVIL) 800 MG tablet Take 1 tablet (800 mg total) by mouth every 8 (eight) hours as needed. 12/05/21   Breeback, Jade L, PA-C  pantoprazole (PROTONIX) 40 MG tablet Take 1 tablet (40 mg total) by mouth daily. 05/12/21   Samuel Bouche, NP  triamcinolone ointment (KENALOG) 0.1 % Apply 1 application topically 2 (two) times daily. To affected areas 05/12/21   Samuel Bouche, NP  Vitamin D, Ergocalciferol, (DRISDOL) 1.25 MG (50000 UNIT) CAPS capsule Take 1 capsule (50,000 Units total) by mouth every 7 (seven) days. 12/05/21   Donella Stade, PA-C    Family History Family History  Problem Relation Age of Onset   Hypertension Mother    Healthy Father     Social History Social History   Tobacco Use   Smoking status: Some Days    Types: Cigarettes   Smokeless tobacco: Never   Tobacco comments:  1-2 cig/day  Vaping Use   Vaping Use: Never used  Substance Use Topics   Alcohol use: Yes    Alcohol/week: 1.0 - 2.0 standard drink of alcohol    Types: 1 Glasses of wine per week    Comment: socially   Drug use: Never     Allergies   Blueberry [vaccinium angustifolium]   Review of Systems Review of Systems See HPI  Physical Exam Triage Vital Signs ED Triage Vitals  Enc Vitals Group     BP 03/21/22 1355 135/81     Pulse Rate 03/21/22 1355 75     Resp 03/21/22 1355 14     Temp 03/21/22 1355 98.7 F (37.1 C)     Temp Source 03/21/22 1355 Oral     SpO2 03/21/22 1355 100 %     Weight --      Height --      Head Circumference --      Peak Flow --      Pain Score 03/21/22 1357 0     Pain Loc --      Pain Edu? --      Excl. in [place]? --    No data found.  Updated Vital Signs BP 135/81 (BP Location:  Left Arm)   Pulse 75   Temp 98.7 F (37.1 C) (Oral)   Resp 14   LMP 11/19/2018   SpO2 100%      Physical Exam Constitutional:      General: She is not in acute distress.    Appearance: She is well-developed.  HENT:     Head: Normocephalic and atraumatic.  Eyes:     Conjunctiva/sclera: Conjunctivae normal.     Pupils: Pupils are equal, round, and reactive to light.  Neck:   Cardiovascular:     Rate and Rhythm: Normal rate.  Pulmonary:     Effort: Pulmonary effort is normal. No respiratory distress.  Abdominal:     General: There is no distension.     Palpations: Abdomen is soft.  Musculoskeletal:        General: Normal range of motion.     Cervical back: Normal range of motion.  Skin:    General: Skin is warm and dry.  Neurological:     Mental Status: She is alert.      UC Treatments / Results  Labs (all labs ordered are listed, but only abnormal results are displayed) Labs Reviewed - No data to display  EKG   Radiology No results found.  Procedures Incision and Drainage  Date/Time: 03/26/2022 8:56 AM  Performed by: Raylene Everts, MD Authorized by: Raylene Everts, MD   Consent:    Consent obtained:  Verbal   Consent given by:  Patient   Alternatives discussed:  No treatment Universal protocol:    Patient identity confirmed:  Verbally with patient Location:    Type:  Abscess   Location:  Trunk   Trunk location:  Back Pre-procedure details:    Skin preparation:  Povidone-iodine Anesthesia:    Anesthesia method:  Local infiltration   Local anesthetic:  Lidocaine 2% WITH epi Procedure type:    Complexity:  Simple Procedure details:    Ultrasound guidance: no     Needle aspiration: no     Incision types:  Stab incision   Incision depth:  Subcutaneous   Wound management:  Probed and deloculated   Drainage:  Purulent   Drainage amount:  Moderate   Packing materials:  1/4 in iodoform  gauze Comments:     The drainage was purulent abscess  material plus sebum consistent with epithelial inclusion cyst  (including critical care time)  Medications Ordered in UC Medications - No data to display  Initial Impression / Assessment and Plan / UC Course  I have reviewed the triage vital signs and the nursing notes.  Pertinent labs & imaging results that were available during my care of the patient were reviewed by me and considered in my medical decision making (see chart for details).     Aftercare discussed Final Clinical Impressions(s) / UC Diagnoses   Final diagnoses:  Abscess  Infected epithelial inclusion cyst     Discharge Instructions      Keep the bandage on as long as it is draining Change dressing daily Take the antibiotic twice a day with food See your doctor if not improved in 48 to 72 hours Small piece of gauze opening the wound should fall out in a day or 2.   ED Prescriptions     Medication Sig Dispense Auth. Provider   doxycycline (VIBRAMYCIN) 100 MG capsule Take 1 capsule (100 mg total) by mouth 2 (two) times daily. 14 capsule Raylene Everts, MD      PDMP not reviewed this encounter.   Raylene Everts, MD 03/26/22 [phone]

## 2022-05-01 ENCOUNTER — Telehealth: Payer: Self-pay | Admitting: Medical-Surgical

## 2022-05-01 ENCOUNTER — Other Ambulatory Visit: Payer: Self-pay | Admitting: Medical-Surgical

## 2022-05-01 ENCOUNTER — Other Ambulatory Visit: Payer: Self-pay | Admitting: Physician Assistant

## 2022-05-01 DIAGNOSIS — J029 Acute pharyngitis, unspecified: Secondary | ICD-10-CM

## 2022-05-01 MED ORDER — AMLODIPINE BESYLATE 10 MG PO TABS: 10.0000 mg | ORAL_TABLET | Freq: Every day | ORAL | 0 refills | Status: DC

## 2022-05-01 NOTE — Telephone Encounter (Signed)
I called pt and left her a VM

## 2022-05-01 NOTE — Telephone Encounter (Signed)
Patient called and scheduled her BP F/U appt with you for 05/12/22 but she did not realize she has run out of her BP meds and is wondering if you could call her in enough to last until her appt with you?

## 2022-05-01 NOTE — Telephone Encounter (Signed)
30-day supply of amlodipine sent to pharmacy on file. ___________________________________________ Clearnce Sorrel, DNP, APRN, FNP-BC Primary Care and [place]

## 2022-05-03 ENCOUNTER — Encounter: Payer: Self-pay | Admitting: Pharmacist

## 2022-05-12 ENCOUNTER — Ambulatory Visit (INDEPENDENT_AMBULATORY_CARE_PROVIDER_SITE_OTHER): Payer: 59 | Admitting: Medical-Surgical

## 2022-05-12 VITALS — BP 119/78 | HR 78 | Resp 17 | Ht 64.0 in | Wt 185.1 lb

## 2022-05-12 DIAGNOSIS — Z1329 Encounter for screening for other suspected endocrine disorder: Secondary | ICD-10-CM

## 2022-05-12 DIAGNOSIS — Z131 Encounter for screening for diabetes mellitus: Secondary | ICD-10-CM

## 2022-05-12 DIAGNOSIS — D649 Anemia, unspecified: Secondary | ICD-10-CM | POA: Diagnosis not present

## 2022-05-12 DIAGNOSIS — I10 Essential (primary) hypertension: Secondary | ICD-10-CM

## 2022-05-12 DIAGNOSIS — N951 Menopausal and female climacteric states: Secondary | ICD-10-CM

## 2022-05-12 DIAGNOSIS — E559 Vitamin D deficiency, unspecified: Secondary | ICD-10-CM

## 2022-05-12 DIAGNOSIS — R39198 Other difficulties with micturition: Secondary | ICD-10-CM

## 2022-05-12 DIAGNOSIS — D509 Iron deficiency anemia, unspecified: Secondary | ICD-10-CM

## 2022-05-12 DIAGNOSIS — R635 Abnormal weight gain: Secondary | ICD-10-CM

## 2022-05-12 DIAGNOSIS — Z1211 Encounter for screening for malignant neoplasm of colon: Secondary | ICD-10-CM

## 2022-05-12 DIAGNOSIS — D259 Leiomyoma of uterus, unspecified: Secondary | ICD-10-CM

## 2022-05-12 MED ORDER — AMLODIPINE BESYLATE 10 MG PO TABS: 10.0000 mg | ORAL_TABLET | Freq: Every day | ORAL | 3 refills | Status: DC

## 2022-05-12 NOTE — Progress Notes (Signed)
Established Patient Office Visit  Subjective   Patient ID: Marissa Chaney, female   DOB: 08/31/1975 Age: 47 y.o. MRN: [id number]   Chief Complaint  Patient presents with   Follow-up    Blood pressure  Breast reduction April 3 Blood work Weight    HPI Pleasant 47 year old female presenting today for the following:  Hypertension: Taking amlodipine 10 mg daily, tolerating well without side effects.  Working to follow a low-sodium diet.  Interested in weight loss to help with her blood pressure and overall health.  Denies CP, SOB, palpitations, lower extremity edema, dizziness, headaches, or vision changes.  Has an upcoming breast reduction surgery planned for early April.  Is very excited about this as she has been waiting a long time to have this done.  Interested in weight loss but wants to know if it would be advisable to start a weight loss medication prior to surgery or or if it should be started after.  Of note, she has gained 15 pounds since her last visit here.  Feels that this is related to emotional eating and anticipatory empty nest syndrome since her daughter is about to go to college.  Requesting to get a full panel of lab work completed.  Feels that this year she wants to get caught up on all of the preventative care that she can make sure everything is well-controlled.   Objective:    Vitals:   05/12/22 1551 05/12/22 1552  BP: 119/78   Pulse: 78   Resp: 17   Height: '5\' 4"'$  (1.626 m)   Weight: 185 lb 1.3 oz (84 kg)   SpO2: 100% 100%  BMI (Calculated): 31.75     Physical Exam Vitals reviewed.  Constitutional:      General: She is not in acute distress.    Appearance: Normal appearance. She is not ill-appearing.  HENT:     Head: Normocephalic and atraumatic.  Cardiovascular:     Rate and Rhythm: Normal rate and regular rhythm.     Pulses: Normal pulses.     Heart sounds: Normal heart sounds.  Pulmonary:     Effort: Pulmonary effort is normal. No respiratory  distress.     Breath sounds: Normal breath sounds. No wheezing, rhonchi or rales.  Skin:    General: Skin is warm and dry.  Neurological:     Mental Status: She is alert and oriented to person, place, and time.  Psychiatric:        Mood and Affect: Mood normal.        Behavior: Behavior normal.        Thought Content: Thought content normal.        Judgment: Judgment normal.   No results found for this or any previous visit (from the past 24 hour(s)).     The 10-year ASCVD risk score (Arnett DK, et al., 2019) is: 2.7%*   Values used to calculate the score:     Age: 11 years     Sex: Female     Is Non-Hispanic African American: Yes     Diabetic: No     Tobacco smoker: Yes     Systolic Blood Pressure: 123456 mmHg     Is BP treated: Yes     HDL Cholesterol: 65 mg/dL*     Total Cholesterol: 229 mg/dL*     * - Cholesterol units were assumed for this score calculation   Assessment & Plan:   1. Hypertension goal BP (blood pressure) < 130/80  Checking labs as below.  Blood pressure looks great today so continue amlodipine 10 mg daily.  Referring to cardiology as she wants to make sure her heart is in good standing with no significant concerns. - Ambulatory referral to Cardiology - CBC with Differential/Platelet - COMPLETE METABOLIC PANEL WITH GFR - Lipid panel  2. Vitamin D deficiency Checking vitamin D. - VITAMIN D 25 Hydroxy (Vit-D Deficiency, Fractures)  3. Normocytic anemia Checking CBC. - CBC with Differential/Platelet  4. Iron deficiency anemia, unspecified iron deficiency anemia type Checking blood counts and iron panel today. - CBC with Differential/Platelet - Iron, TIBC and Ferritin Panel  5. Weight gain Checking insulin and cortisol.  Discussed using weight loss medications.  Recommend diet and lifestyle modifications for now.  Would like to wait until she has completed her surgery and healing before we consider adding a medication that could impede the process. -  Insulin, random - Cortisol-am, blood  6. Colon cancer screening Referring to GI for colonoscopy. - Ambulatory referral to Gastroenterology  7. Thyroid disorder screen Checking TSH. - TSH  8. Diabetes mellitus screening Checking hemoglobin A1c. - Hemoglobin A1c  9. Menopausal symptoms She does have some menopausal symptoms but has been doing this for several years.  Would like her hormones checked.  Checking as below. - FSH/LH - Testosterone - Estradiol  10. Uterine leiomyoma, unspecified location Remains considerably concerned about possible cancer.  She has had this checked previously and would like to recheck her CA125.  Ordered today. - CA 125  11. Infrequent urination Tends to urinate infrequently and is worried about possible urinary tract infection.  Checking urinalysis and culture today. - Urinalysis, Routine w reflex microscopic - Urine Culture  Return in about 6 months (around 11/10/2022) for chronic disease follow up.  ___________________________________________ Clearnce Sorrel, DNP, APRN, FNP-BC Primary Care and [place]

## 2022-05-15 DIAGNOSIS — H2633 Drug-induced cataract, bilateral: Secondary | ICD-10-CM | POA: Insufficient documentation

## 2022-05-15 DIAGNOSIS — H04033 Chronic enlargement of bilateral lacrimal glands: Secondary | ICD-10-CM | POA: Insufficient documentation

## 2022-05-15 DIAGNOSIS — H25013 Cortical age-related cataract, bilateral: Secondary | ICD-10-CM | POA: Insufficient documentation

## 2022-05-18 ENCOUNTER — Telehealth: Payer: Self-pay

## 2022-05-18 DIAGNOSIS — Z113 Encounter for screening for infections with a predominantly sexual mode of transmission: Secondary | ICD-10-CM

## 2022-05-18 DIAGNOSIS — Z01818 Encounter for other preprocedural examination: Secondary | ICD-10-CM

## 2022-05-18 NOTE — Telephone Encounter (Signed)
As with any additional workup, we do need to be able to justify the reason for checking the labs.  Regardless of getting the labs for free, it is not recommended to do such workup with no concerning symptoms.  We could certainly justify the PT/INR however would need to further discuss the concerns regarding lupus or factor V Leiden.  STI testing is fine. ___________________________________________ Clearnce Sorrel, DNP, APRN, FNP-BC Primary Care and [place]

## 2022-05-18 NOTE — Telephone Encounter (Signed)
patient requesting additional lab work be ordered . Requesting  PT/INR, lupus panel,  factor 5 ledien and STI checking-  patient states she gets all of her lab work for free and she is having surgery soon and wanting these labs before the surgery. Patient requesting a call back .

## 2022-05-19 NOTE — Telephone Encounter (Signed)
Attempted to call patient. Left voicemail message requesting a return call.

## 2022-05-20 LAB — COMPLETE METABOLIC PANEL WITH GFR
AG Ratio: 1.7 (calc) (ref 1.0–2.5)
ALT: 10 U/L (ref 6–29)
AST: 15 U/L (ref 10–35)
Albumin: 4.1 g/dL (ref 3.6–5.1)
Alkaline phosphatase (APISO): 90 U/L (ref 31–125)
BUN: 8 mg/dL (ref 7–25)
CO2: 26 mmol/L (ref 20–32)
Calcium: 9.8 mg/dL (ref 8.6–10.2)
Chloride: 106 mmol/L (ref 98–110)
Creat: 0.62 mg/dL (ref 0.50–0.99)
Globulin: 2.4 g/dL (calc) (ref 1.9–3.7)
Glucose, Bld: 85 mg/dL (ref 65–99)
Potassium: 4.5 mmol/L (ref 3.5–5.3)
Sodium: 140 mmol/L (ref 135–146)
Total Bilirubin: 0.4 mg/dL (ref 0.2–1.2)
Total Protein: 6.5 g/dL (ref 6.1–8.1)
eGFR: 112 mL/min/{1.73_m2} (ref >=60)

## 2022-05-20 LAB — URINE CULTURE
MICRO NUMBER:: 14627857
SPECIMEN QUALITY:: ADEQUATE

## 2022-05-20 LAB — LIPID PANEL
Cholesterol: 217 mg/dL — ABNORMAL HIGH (ref <200)
HDL: 72 mg/dL (ref >=50)
LDL Cholesterol (Calc): 128 mg/dL (calc) — ABNORMAL HIGH
Non-HDL Cholesterol (Calc): 145 mg/dL (calc) — ABNORMAL HIGH (ref <130)
Total CHOL/HDL Ratio: 3 (calc) (ref <5.0)
Triglycerides: 71 mg/dL (ref <150)

## 2022-05-20 LAB — ESTRADIOL: Estradiol: 192 pg/mL

## 2022-05-20 LAB — CORTISOL-AM, BLOOD: Cortisol - AM: 20.7 ug/dL

## 2022-05-20 LAB — CBC WITH DIFFERENTIAL/PLATELET
Absolute Monocytes: 599 cells/uL (ref 200–950)
Basophils Absolute: 38 cells/uL (ref 0–200)
Basophils Relative: 0.7 %
Eosinophils Absolute: 270 cells/uL (ref 15–500)
Eosinophils Relative: 5 %
HCT: 39.5 % (ref 35.0–45.0)
Hemoglobin: 12.9 g/dL (ref 11.7–15.5)
Lymphs Abs: 1798 cells/uL (ref 850–3900)
MCH: 31.5 pg (ref 27.0–33.0)
MCHC: 32.7 g/dL (ref 32.0–36.0)
MCV: 96.6 fL (ref 80.0–100.0)
MPV: 10.8 fL (ref 7.5–12.5)
Monocytes Relative: 11.1 %
Neutro Abs: 2695 cells/uL (ref 1500–7800)
Neutrophils Relative %: 49.9 %
Platelets: 303 10*3/uL (ref 140–400)
RBC: 4.09 10*6/uL (ref 3.80–5.10)
RDW: 12.2 % (ref 11.0–15.0)
Total Lymphocyte: 33.3 %
WBC: 5.4 10*3/uL (ref 3.8–10.8)

## 2022-05-20 LAB — URINALYSIS, ROUTINE W REFLEX MICROSCOPIC
Bilirubin Urine: NEGATIVE
Glucose, UA: NEGATIVE
Hgb urine dipstick: NEGATIVE
Ketones, ur: NEGATIVE
Leukocytes,Ua: NEGATIVE
Nitrite: NEGATIVE
Protein, ur: NEGATIVE
Specific Gravity, Urine: 1.021 (ref 1.001–1.035)
pH: 6.5 (ref 5.0–8.0)

## 2022-05-20 LAB — IRON,TIBC AND FERRITIN PANEL
%SAT: 21 % (calc) (ref 16–45)
Ferritin: 18 ng/mL (ref 16–232)
Iron: 73 ug/dL (ref 40–190)
TIBC: 343 mcg/dL (calc) (ref 250–450)

## 2022-05-20 LAB — CA 125: CA 125: 34 U/mL (ref <35)

## 2022-05-20 LAB — FSH/LH
FSH: 7.2 m[IU]/mL
LH: 6.4 m[IU]/mL

## 2022-05-20 LAB — HEMOGLOBIN A1C
Hgb A1c MFr Bld: 5.4 % of total Hgb (ref <5.7)
Mean Plasma Glucose: 108 mg/dL
eAG (mmol/L): 6 mmol/L

## 2022-05-20 LAB — VITAMIN D 25 HYDROXY (VIT D DEFICIENCY, FRACTURES): Vit D, 25-Hydroxy: 42 ng/mL (ref 30–100)

## 2022-05-20 LAB — TSH: TSH: 0.97 mIU/L

## 2022-05-20 LAB — INSULIN, RANDOM: Insulin: 12 u[IU]/mL

## 2022-05-20 LAB — TESTOSTERONE, TOTAL, LC/MS/MS: Testosterone, Total, LC-MS-MS: 33 ng/dL (ref 2–45)

## 2022-05-23 ENCOUNTER — Other Ambulatory Visit: Payer: Self-pay | Admitting: Pharmacist

## 2022-05-23 NOTE — Patient Instructions (Signed)
Marissa Chaney,  Thank you for speaking with me today! As discussed, keep up the good work with taking blood pressure medicine and spot checking your pressures, at work if need be, or using a machine at home.  Below are some helpful tips about checking blood pressure:  Check your blood pressure periodically, and any time you have concerning symptoms like headache, chest pain, dizziness, shortness of breath, or vision changes.   Our goal is less than 140/90.  To appropriately check your blood pressure, make sure you do the following:  1) Avoid caffeine, exercise, or tobacco products for 30 minutes before checking. Empty your bladder. 2) Sit with your back supported in a flat-backed chair. Rest your arm on something flat (arm of the chair, table, etc). 3) Sit still with your feet flat on the floor, resting, for at least 5 minutes.  4) Check your blood pressure. Take 1-2 readings.  5) Write down these readings and bring with you to any provider appointments.  Bring your home blood pressure machine with you to a provider's office for accuracy comparison at least once a year.   Make sure you take your blood pressure medications before you come to any office visit, even if you were asked to fast for labs.   Take care, Marissa Chaney, PharmD Clinical Pharmacist [facility] [phone]

## 2022-05-23 NOTE — Progress Notes (Signed)
Patient appearing on report for [employer] - Hypertension Control report due to last documented ambulatory blood pressure of 140/91 on 12/05/21. Next appointment with PCP is not yet scheduled.   Outreached patient to discuss hypertension control and medication management.   Current antihypertensives: amlodipine '10mg'$  daily  Patient has an automated upper arm home BP machine - can check at work, works as [employer] fundraiser   Current blood pressure readings: 119/78 at recent PCP visit   Patient denies hypotensive signs and symptoms including dizziness, lightheadedness.  Patient denies hypertensive symptoms including headache, chest pain, shortness of breath.    Assessment/Plan: - Currently controlled - - Reviewed goal blood pressure <140/90 - Reviewed appropriate administration of medication regimen - Discussed dietary modifications, such as reduced salt intake, focus on whole grains, vegetables, lean proteins - Discussed goal of 150 minutes of moderate intensity physical activity weekly - Recommend continue current regimen  Larinda Buttery, PharmD Clinical Pharmacist [facility] [phone]

## 2022-05-24 NOTE — Telephone Encounter (Signed)
Patient informed and will be getting blood work with another  provider soon. She thanks [place] for all of her help.

## 2022-07-27 ENCOUNTER — Telehealth: Payer: Self-pay

## 2022-07-27 NOTE — Transitions of Care (Post Inpatient/ED Visit) (Signed)
   07/27/2022  Name: Marissa Chaney MRN: [id number] DOB: January 24, 1976  Today's TOC FU Call Status: Today's TOC FU Call Status:: Unsuccessul Call (1st Attempt) Unsuccessful Call (1st Attempt) Date: 07/27/22  Attempted to reach the patient regarding the most recent Inpatient/ED visit.  Follow Up Plan: Additional outreach attempts will be made to reach the patient to complete the Transitions of Care (Post Inpatient/ED visit) call.   Signature Karena Addison, LPN [facility] Nurse Health Advisor Direct Dial [phone]

## 2022-07-28 NOTE — Transitions of Care (Post Inpatient/ED Visit) (Unsigned)
   07/28/2022  Name: Marissa Chaney MRN: [id number] DOB: 1976/03/05  Today's TOC FU Call Status: Today's TOC FU Call Status:: Unsuccessful Call (2nd Attempt) Unsuccessful Call (1st Attempt) Date: 07/27/22 Unsuccessful Call (2nd Attempt) Date: 07/28/22  Attempted to reach the patient regarding the most recent Inpatient/ED visit.  Follow Up Plan: Additional outreach attempts will be made to reach the patient to complete the Transitions of Care (Post Inpatient/ED visit) call.   Signature Karena Addison, LPN [facility] Nurse Health Advisor Direct Dial [phone]

## 2022-07-31 NOTE — Transitions of Care (Post Inpatient/ED Visit) (Signed)
   07/31/2022  Name: Marissa Chaney MRN: [id number] DOB: [birth year]  Today's TOC FU Call Status: Today's TOC FU Call Status:: Unsuccessful Call (3rd Attempt) Unsuccessful Call (1st Attempt) Date: 07/27/22 Unsuccessful Call (2nd Attempt) Date: 07/28/22 Unsuccessful Call (3rd Attempt) Date: 07/31/22  Attempted to reach the patient regarding the most recent Inpatient/ED visit.  Follow Up Plan: No further outreach attempts will be made at this time. We have been unable to contact the patient.  Signature Karena Addison, LPN [facility] Nurse Health Advisor Direct Dial [phone]

## 2022-08-04 ENCOUNTER — Encounter: Payer: Self-pay | Admitting: Medical-Surgical

## 2022-08-04 ENCOUNTER — Ambulatory Visit (INDEPENDENT_AMBULATORY_CARE_PROVIDER_SITE_OTHER): Payer: 59 | Admitting: Medical-Surgical

## 2022-08-04 VITALS — BP 109/71 | HR 87 | Resp 20 | Ht 64.0 in | Wt 179.8 lb

## 2022-08-04 DIAGNOSIS — E041 Nontoxic single thyroid nodule: Secondary | ICD-10-CM | POA: Insufficient documentation

## 2022-08-04 DIAGNOSIS — Z09 Encounter for follow-up examination after completed treatment for conditions other than malignant neoplasm: Secondary | ICD-10-CM

## 2022-08-04 DIAGNOSIS — I2699 Other pulmonary embolism without acute cor pulmonale: Secondary | ICD-10-CM | POA: Diagnosis not present

## 2022-08-04 MED ORDER — APIXABAN 5 MG PO TABS: 5.0000 mg | ORAL_TABLET | Freq: Two times a day (BID) | ORAL | 1 refills | Status: DC

## 2022-08-04 NOTE — Progress Notes (Signed)
        Established patient visit  History, exam, impression, and plan:  1. Hospital discharge follow-up 2. Other acute pulmonary embolism without acute cor pulmonale (HCC) Marissa Chaney 47 year old female presenting today for hospital discharge follow-up.  She had surgery in April for bilateral breast reduction and was recovering well.  Unfortunately, she developed some significant shortness of breath and was advised by her surgeon to report to the ED.  While she was there, she had a CTA which showed a small left lower lung pulmonary embolus.  She was admitted for 2 days before being discharged on Eliquis.  She has completed the initial starter week of 10 mg twice daily and started 5 mg twice daily yesterday.  Tolerating medication well without side effects.  Has noted a bit of a dry cough as well as slight sinus drainage but these symptoms are gradually improving.  On Monday, she did see a very small spot of blood with her bowel movement but no further hematochezia or melena.  Since her hospitalization, she notes that she has bilateral lower extremity swelling intermittently, left worse than right.  While she was inpatient, they noted an incidental finding of thyroid nodules but her ultrasound for DVT was negative.  On exam, HRRR, S1/S2 normal.  Lungs CTA with even and unlabored respirations.  Every once in a while, she does take a deep breath because she has some subjective shortness of breath.  Has some health-related anxiety that is worsening her chronic insomnia.  Reassurance provided today.  Plan to continue Eliquis 5 mg twice daily for 6 months given her history of sarcoidosis.  Refills sent today.  3. Thyroid nodule Recent TSH checked with normal findings.  Ordering ultrasound of the thyroid for further evaluation.  Procedures performed this visit: None.  Return in about 3 months (around 11/04/2022) for chronic disease follow up.  __________________________________ Thayer Ohm, DNP, APRN,  FNP-BC Primary Care and Sports Medicine [facility] [place]

## 2022-08-08 ENCOUNTER — Ambulatory Visit (INDEPENDENT_AMBULATORY_CARE_PROVIDER_SITE_OTHER): Payer: 59

## 2022-08-08 DIAGNOSIS — E041 Nontoxic single thyroid nodule: Secondary | ICD-10-CM

## 2022-10-28 ENCOUNTER — Other Ambulatory Visit: Payer: Self-pay | Admitting: Physician Assistant

## 2022-10-28 DIAGNOSIS — J029 Acute pharyngitis, unspecified: Secondary | ICD-10-CM

## 2022-11-01 ENCOUNTER — Encounter: Payer: Self-pay | Admitting: Medical-Surgical

## 2022-11-06 ENCOUNTER — Telehealth: Payer: Self-pay | Admitting: Medical-Surgical

## 2022-11-06 NOTE — Telephone Encounter (Signed)
Patient called requesting lab work (PT INR) to check her blood thickness. Patient stated she has not had blood work since her blood clot.   Patient requesting a call back.  Lab - [facility]

## 2022-11-08 NOTE — Telephone Encounter (Signed)
Sent MyChart message to patient

## 2022-11-10 ENCOUNTER — Encounter: Payer: Self-pay | Admitting: Medical-Surgical

## 2022-11-10 ENCOUNTER — Ambulatory Visit (INDEPENDENT_AMBULATORY_CARE_PROVIDER_SITE_OTHER): Payer: 59 | Admitting: Medical-Surgical

## 2022-11-10 VITALS — BP 117/72 | HR 64 | Ht 64.0 in | Wt 176.1 lb

## 2022-11-10 DIAGNOSIS — F32A Depression, unspecified: Secondary | ICD-10-CM

## 2022-11-10 DIAGNOSIS — Z7901 Long term (current) use of anticoagulants: Secondary | ICD-10-CM

## 2022-11-10 DIAGNOSIS — I2699 Other pulmonary embolism without acute cor pulmonale: Secondary | ICD-10-CM | POA: Diagnosis not present

## 2022-11-10 DIAGNOSIS — R5382 Chronic fatigue, unspecified: Secondary | ICD-10-CM

## 2022-11-10 DIAGNOSIS — R300 Dysuria: Secondary | ICD-10-CM | POA: Diagnosis not present

## 2022-11-10 DIAGNOSIS — I10 Essential (primary) hypertension: Secondary | ICD-10-CM | POA: Diagnosis not present

## 2022-11-10 DIAGNOSIS — D509 Iron deficiency anemia, unspecified: Secondary | ICD-10-CM

## 2022-11-10 DIAGNOSIS — E559 Vitamin D deficiency, unspecified: Secondary | ICD-10-CM

## 2022-11-10 DIAGNOSIS — Z833 Family history of diabetes mellitus: Secondary | ICD-10-CM

## 2022-11-10 DIAGNOSIS — F419 Anxiety disorder, unspecified: Secondary | ICD-10-CM

## 2022-11-10 MED ORDER — SPIRONOLACTONE 25 MG PO TABS: 12.5000 mg | ORAL_TABLET | Freq: Every day | ORAL | 3 refills | Status: DC | PRN

## 2022-11-10 NOTE — Progress Notes (Unsigned)
        Established patient visit  History, exam, impression, and plan:  No problem-specific Assessment & Plan notes found for this encounter.   Procedures performed this visit: None.  No follow-ups on file.  __________________________________ Thayer Ohm, DNP, APRN, FNP-BC Primary Care and Sports Medicine [facility] [place]

## 2022-11-11 ENCOUNTER — Encounter: Payer: Self-pay | Admitting: Medical-Surgical

## 2022-11-16 LAB — COMPLETE METABOLIC PANEL WITH GFR
AG Ratio: 1.6 (calc) (ref 1.0–2.5)
ALT: 11 U/L (ref 6–29)
AST: 19 U/L (ref 10–35)
Albumin: 4.2 g/dL (ref 3.6–5.1)
Alkaline phosphatase (APISO): 105 U/L (ref 31–125)
BUN: 8 mg/dL (ref 7–25)
CO2: 24 mmol/L (ref 20–32)
Calcium: 9.8 mg/dL (ref 8.6–10.2)
Chloride: 105 mmol/L (ref 98–110)
Creat: 0.59 mg/dL (ref 0.50–0.99)
Globulin: 2.6 g/dL (ref 1.9–3.7)
Glucose, Bld: 71 mg/dL (ref 65–99)
Potassium: 4.5 mmol/L (ref 3.5–5.3)
Sodium: 138 mmol/L (ref 135–146)
Total Bilirubin: 0.3 mg/dL (ref 0.2–1.2)
Total Protein: 6.8 g/dL (ref 6.1–8.1)
eGFR: 112 mL/min/{1.73_m2} (ref >=60)

## 2022-11-16 LAB — CBC WITH DIFFERENTIAL/PLATELET
Absolute Monocytes: 551 cells/uL (ref 200–950)
Basophils Absolute: 49 {cells}/uL (ref 0–200)
Basophils Relative: 0.9 %
Eosinophils Absolute: 292 {cells}/uL (ref 15–500)
Eosinophils Relative: 5.4 %
HCT: 39.2 % (ref 35.0–45.0)
Hemoglobin: 12.9 g/dL (ref 11.7–15.5)
Lymphs Abs: 2111 {cells}/uL (ref 850–3900)
MCH: 31.2 pg (ref 27.0–33.0)
MCHC: 32.9 g/dL (ref 32.0–36.0)
MCV: 94.9 fL (ref 80.0–100.0)
MPV: 10.7 fL (ref 7.5–12.5)
Monocytes Relative: 10.2 %
Neutro Abs: 2398 {cells}/uL (ref 1500–7800)
Neutrophils Relative %: 44.4 %
Platelets: 310 10*3/uL (ref 140–400)
RBC: 4.13 10*6/uL (ref 3.80–5.10)
RDW: 13.5 % (ref 11.0–15.0)
Total Lymphocyte: 39.1 %
WBC: 5.4 10*3/uL (ref 3.8–10.8)

## 2022-11-16 LAB — IRON,TIBC AND FERRITIN PANEL
%SAT: 16 % (ref 16–45)
Ferritin: 17 ng/mL (ref 16–232)
Iron: 58 ug/dL (ref 40–190)
TIBC: 367 ug/dL (ref 250–450)

## 2022-11-16 LAB — LIPID PANEL
Cholesterol: 229 mg/dL — ABNORMAL HIGH (ref <200)
HDL: 87 mg/dL (ref >=50)
LDL Cholesterol (Calc): 127 mg/dL — ABNORMAL HIGH
Non-HDL Cholesterol (Calc): 142 mg/dL — ABNORMAL HIGH (ref <130)
Total CHOL/HDL Ratio: 2.6 (calc) (ref <5.0)
Triglycerides: 61 mg/dL (ref <150)

## 2022-11-16 LAB — URINALYSIS W MICROSCOPIC + REFLEX CULTURE
Bilirubin Urine: NEGATIVE
Glucose, UA: NEGATIVE
Hgb urine dipstick: NEGATIVE
Hyaline Cast: NONE SEEN /LPF
Leukocyte Esterase: NEGATIVE
Nitrites, Initial: NEGATIVE
Specific Gravity, Urine: 1.032 (ref 1.001–1.035)
pH: 5.5 (ref 5.0–8.0)

## 2022-11-16 LAB — NO CULTURE INDICATED

## 2022-11-16 LAB — VITAMIN D 25 HYDROXY (VIT D DEFICIENCY, FRACTURES): Vit D, 25-Hydroxy: 33 ng/mL (ref 30–100)

## 2022-11-16 LAB — MAGNESIUM: Magnesium: 2.2 mg/dL (ref 1.5–2.5)

## 2022-11-16 LAB — VITAMIN B12: Vitamin B-12: 241 pg/mL (ref 200–1100)

## 2022-11-16 LAB — CP4508-PT/INR AND PTT
INR: 1
Prothrombin Time: 10.6 s (ref 9.0–11.5)
aPTT: 28 s (ref 23–32)

## 2022-11-16 LAB — HEMOGLOBIN A1C
Hgb A1c MFr Bld: 5.5 %{Hb} (ref <5.7)
Mean Plasma Glucose: 111 mg/dL
eAG (mmol/L): 6.2 mmol/L

## 2022-11-16 LAB — TSH: TSH: 1.4 m[IU]/L

## 2022-12-08 ENCOUNTER — Ambulatory Visit
Admission: EM | Admit: 2022-12-08 | Discharge: 2022-12-08 | Disposition: A | Discharge status: Home / Self Care | Payer: 59 | Attending: Physician Assistant | Admitting: Physician Assistant

## 2022-12-08 ENCOUNTER — Encounter: Payer: Self-pay | Admitting: Emergency Medicine

## 2022-12-08 DIAGNOSIS — L720 Epidermal cyst: Secondary | ICD-10-CM | POA: Diagnosis not present

## 2022-12-08 HISTORY — DX: Other pulmonary embolism without acute cor pulmonale: I26.99

## 2022-12-08 MED ORDER — DOXYCYCLINE HYCLATE 100 MG PO CAPS: 100.0000 mg | ORAL_CAPSULE | Freq: Two times a day (BID) | ORAL | 0 refills | Status: DC

## 2022-12-08 NOTE — ED Provider Notes (Signed)
[name]    CSN: [id number] Arrival date & time: 12/08/22  1242      History   Chief Complaint Chief Complaint  Patient presents with   Cyst    HPI Marissa Chaney is a 47 y.o. female.   Patient complains of a knot on her right chest.  Patient reports that she has had a similar episode in the past.  Patient reports that the area required incision and draining.  Patient is hoping that she can take an antibiotic for this area.  Patient is on a blood thinner.     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   Dry eyes, bilateral 09/30/2018   Myopia of both eyes 09/30/2018   Regular astigmatism of both eyes 09/30/2018   Cervical polyp 05/31/2018   Myofascial pain 05/31/2018   Lichen simplex chronicus 05/01/2018   Chronic fatigue 05/01/2018   Hypercalcemia 04/29/2018   Iron deficiency anemia 04/29/2018   Vitamin D deficiency 04/26/2018   Hypertension goal BP (blood pressure) < 130/80 04/26/2018   Sarcoidosis 09/09/2014    Past Surgical History:  Procedure Laterality Date   BREAST SURGERY     Breast reduction   NO PAST SURGERIES     TOTAL VAGINAL HYSTERECTOMY     VAGINAL HYSTERECTOMY Bilateral 12/10/2018   Procedure: HYSTERECTOMY VAGINAL WITH SALPINGECTOMY;  Surgeon: Allie Bossier, MD;  Location: [facility] OR;  Service: Gynecology;  Laterality: Bilateral;    OB History     Gravida  5   Para  2   Term      Preterm      AB  3   Living  2      SAB  1   IAB  2   Ectopic      Multiple      Live Births               Home Medications    Prior to Admission medications    Medication Sig Start Date End Date Taking? Authorizing Provider  amLODipine (NORVASC) 10 MG tablet Take 1 tablet (10 mg total) by mouth daily. 05/12/22  Yes Christen Butter, NP  apixaban (ELIQUIS) 5 MG TABS tablet Take 1 tablet (5 mg total) by mouth 2 (two) times daily. 08/04/22  Yes Christen Butter, NP  doxycycline (VIBRAMYCIN) 100 MG capsule Take 1 capsule (100 mg total) by mouth 2 (two) times daily. 12/08/22  Yes Elson Areas, PA-C  spironolactone (ALDACTONE) 25 MG tablet Take 0.5-1 tablets (12.5-25 mg total) by mouth daily as needed (lower extremity edema). 11/10/22  Yes Christen Butter, NP  Vitamin D, Ergocalciferol, (DRISDOL) 1.25 MG (50000 UNIT) CAPS capsule Take 1 capsule (50,000 Units total) by mouth every 7 (seven) days. 12/05/21  Yes Breeback, Jade L, PA-C  clotrimazole-betamethasone (LOTRISONE) cream Apply 1 application topically 2 (two) times daily. 05/20/21   Monica Becton, MD  cyclobenzaprine (FLEXERIL) 10 MG tablet Take 1 tablet (10 mg total) by mouth at bedtime. 05/20/21   Monica Becton, MD  pantoprazole (PROTONIX) 40 MG tablet Take 1 tablet (40 mg total) by mouth daily.  05/12/21   Christen Butter, NP  triamcinolone ointment (KENALOG) 0.1 % Apply 1 application topically 2 (two) times daily. To affected areas 05/12/21   Christen Butter, NP    Family History Family History  Problem Relation Age of Onset   Hypertension Mother    Healthy Father     Social History Social History   Tobacco Use   Smoking status: Some Days    Types: Cigarettes   Smokeless tobacco: Never   Tobacco comments:    1-2 cig/day  Vaping Use   Vaping status: Never Used  Substance Use Topics   Alcohol use: Yes    Alcohol/week: 1.0 - 2.0 standard drink of alcohol    Types: 1 Glasses of wine per week    Comment: socially   Drug use: Never     Allergies   Blueberry [vaccinium angustifolium]   Review of Systems Review of Systems  All other systems reviewed and are negative.    Physical  Exam Triage Vital Signs ED Triage Vitals  Encounter Vitals Group     BP 12/08/22 1259 132/87     Systolic BP Percentile --      Diastolic BP Percentile --      Pulse Rate 12/08/22 1259 93     Resp 12/08/22 1259 16     Temp 12/08/22 1259 98 F (36.7 C)     Temp Source 12/08/22 1259 Oral     SpO2 12/08/22 1259 100 %     Weight --      Height --      Head Circumference --      Peak Flow --      Pain Score 12/08/22 1334 3     Pain Loc --      Pain Education --      Exclude from Growth Chart --    No data found.  Updated Vital Signs BP 132/87 (BP Location: Right Arm)   Pulse 93   Temp 98 F (36.7 C) (Oral)   Resp 16   LMP 11/19/2018   SpO2 100%   Visual Acuity Right Eye Distance:   Left Eye Distance:   Bilateral Distance:    Right Eye Near:   Left Eye Near:    Bilateral Near:     Physical Exam Vitals and nursing note reviewed.  Constitutional:      Appearance: She is well-developed.  HENT:     Head: Normocephalic.  Cardiovascular:     Comments: 1 cm area of swelling in her right chest dark center, no fluctuance Pulmonary:     Effort: Pulmonary effort is normal.  Abdominal:     General: There is no distension.  Musculoskeletal:        General: Normal range of motion.     Cervical back: Normal range of motion.  Neurological:     Mental Status: She is alert and oriented to person, place, and time.      UC Treatments / Results  Labs (all labs ordered are listed, but only abnormal results are displayed) Labs Reviewed - No data to display  EKG   Radiology No results found.  Procedures Procedures (including critical care time)  Medications Ordered in UC Medications - No data to display  Initial Impression / Assessment and Plan / UC Course  I have reviewed the triage vital signs and the nursing notes.  Pertinent labs & imaging results that were available during my care of the patient were reviewed by me and considered in my  medical decision making  (see chart for details).    Patient is given a prescription for doxycycline.  She is advised warm compresses 20 minutes 4 times a day.  Patient is advised to return if any problems.  Final Clinical Impressions(s) / UC Diagnoses   Final diagnoses:  None     Discharge Instructions      Soak area 20 miunutes 4 times a day    ED Prescriptions     Medication Sig Dispense Auth. Provider   doxycycline (VIBRAMYCIN) 100 MG capsule Take 1 capsule (100 mg total) by mouth 2 (two) times daily. 20 capsule Elson Areas, [place]      PDMP not reviewed this encounter. An After Visit Summary was printed and given to the patient.       Elson Areas, [place] 12/08/22 1339

## 2022-12-08 NOTE — Discharge Instructions (Signed)
Soak area 20 miunutes 4 times a day

## 2022-12-08 NOTE — ED Triage Notes (Signed)
Dx with cyst on right breast sometime last year. Last two days it's become red, swollen, and tender to touch. Pt did have a similar area lanced on her back in the past. Patient states she's currently on eliquis d/t PE a couple months back

## 2022-12-16 ENCOUNTER — Other Ambulatory Visit: Payer: Self-pay

## 2022-12-16 ENCOUNTER — Ambulatory Visit
Admission: EM | Admit: 2022-12-16 | Discharge: 2022-12-16 | Disposition: A | Discharge status: Home / Self Care | Payer: 59 | Attending: Urgent Care | Admitting: Urgent Care

## 2022-12-16 DIAGNOSIS — Z5189 Encounter for other specified aftercare: Secondary | ICD-10-CM

## 2022-12-16 MED ORDER — MUPIROCIN 2 % EX OINT: 1.0000 | TOPICAL_OINTMENT | Freq: Three times a day (TID) | CUTANEOUS | 0 refills | Status: DC

## 2022-12-16 NOTE — ED Provider Notes (Signed)
[facility]    CSN: [id number] Arrival date & time: 12/16/22  1231      History   Chief Complaint Chief Complaint  Patient presents with   Wound Check    HPI Marissa Chaney is a 47 y.o. female.   Pleasant 47 year old female presents today due to concerns of the appearance of her recent cyst on her right breast.  Was seen on 12/08/2022 and diagnosed with an infected epidermoid cyst.  Was started on doxycycline.  Cyst removal was not performed as patient is on blood thinners.  Patient had been applying warm compresses to the area, but states she accidentally scalded her skin with one of the warm compresses.  Patient has also been applying hydrogel burn pads to the area.  She is here primarily for a recheck just to make sure nothing else needs to be done.  Overall feels the area is improving, but states it looks like a "hole" in her skin. No fever, redness of skin or drainage.    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   Dry eyes, bilateral 09/30/2018   Myopia of both eyes 09/30/2018   Regular astigmatism of both eyes 09/30/2018   Cervical polyp 05/31/2018   Myofascial pain 05/31/2018   Lichen simplex chronicus 05/01/2018   Chronic fatigue 05/01/2018   Hypercalcemia 04/29/2018   Iron deficiency anemia 04/29/2018   Vitamin D deficiency 04/26/2018   Hypertension goal BP (blood pressure) < 130/80 04/26/2018   Sarcoidosis 09/09/2014    Past Surgical History:  Procedure Laterality Date   BREAST SURGERY     Breast reduction   NO PAST SURGERIES     TOTAL VAGINAL HYSTERECTOMY      VAGINAL HYSTERECTOMY Bilateral 12/10/2018   Procedure: HYSTERECTOMY VAGINAL WITH SALPINGECTOMY;  Surgeon: Allie Bossier, MD;  Location: [facility] OR;  Service: Gynecology;  Laterality: Bilateral;    OB History     Gravida  5   Para  2   Term      Preterm      AB  3   Living  2      SAB  1   IAB  2   Ectopic      Multiple      Live Births               Home Medications    Prior to Admission medications   Medication Sig Start Date End Date Taking? Authorizing Provider  mupirocin ointment (BACTROBAN) 2 % Apply 1 Application topically 3 (three) times daily. 12/16/22  Yes Shirla Hodgkiss L, PA  amLODipine (NORVASC) 10 MG tablet Take 1 tablet (10 mg total) by mouth daily. 05/12/22   Christen Butter, NP  apixaban (ELIQUIS) 5 MG TABS tablet Take 1 tablet (5 mg total) by mouth 2 (two) times daily. 08/04/22   Christen Butter, NP  clotrimazole-betamethasone (LOTRISONE) cream Apply 1 application topically 2 (two) times daily. 05/20/21   Monica Becton, MD  cyclobenzaprine (FLEXERIL) 10 MG tablet Take 1 tablet (10 mg total) by mouth at bedtime. 05/20/21   Thekkekandam,  Ihor Austin, MD  doxycycline (VIBRAMYCIN) 100 MG capsule Take 1 capsule (100 mg total) by mouth 2 (two) times daily. 12/08/22   Elson Areas, PA-C  pantoprazole (PROTONIX) 40 MG tablet Take 1 tablet (40 mg total) by mouth daily. 05/12/21   Christen Butter, NP  spironolactone (ALDACTONE) 25 MG tablet Take 0.5-1 tablets (12.5-25 mg total) by mouth daily as needed (lower extremity edema). 11/10/22   Christen Butter, NP  triamcinolone ointment (KENALOG) 0.1 % Apply 1 application topically 2 (two) times daily. To affected areas 05/12/21   Christen Butter, NP  Vitamin D, Ergocalciferol, (DRISDOL) 1.25 MG (50000 UNIT) CAPS capsule Take 1 capsule (50,000 Units total) by mouth every 7 (seven) days. 12/05/21   Jomarie Longs, PA-C    Family History Family History  Problem Relation Age of Onset   Hypertension Mother    Healthy Father     Social  History Social History   Tobacco Use   Smoking status: Some Days    Types: Cigarettes   Smokeless tobacco: Never   Tobacco comments:    1-2 cig/day  Vaping Use   Vaping status: Never Used  Substance Use Topics   Alcohol use: Yes    Alcohol/week: 1.0 - 2.0 standard drink of alcohol    Types: 1 Glasses of wine per week    Comment: socially   Drug use: Never     Allergies   Blueberry [vaccinium angustifolium]   Review of Systems Review of Systems As per HPI  Physical Exam Triage Vital Signs ED Triage Vitals [12/16/22 1248]  Encounter Vitals Group     BP      Systolic BP Percentile      Diastolic BP Percentile      Pulse      Resp      Temp      Temp src      SpO2      Weight      Height      Head Circumference      Peak Flow      Pain Score 5     Pain Loc      Pain Education      Exclude from Growth Chart    No data found.  Updated Vital Signs LMP 11/19/2018   Visual Acuity Right Eye Distance:   Left Eye Distance:   Bilateral Distance:    Right Eye Near:   Left Eye Near:    Bilateral Near:     Physical Exam Vitals and nursing note reviewed.  Constitutional:      General: She is not in acute distress.    Appearance: Normal appearance. She is normal weight. She is not ill-appearing or toxic-appearing.  HENT:     Head: Normocephalic and atraumatic.  Cardiovascular:     Rate and Rhythm: Normal rate.  Pulmonary:     Effort: Pulmonary effort is normal. No respiratory distress.  Chest:       Comments: Surrounding skin non-infected. No drainage.  Musculoskeletal:     Cervical back: Normal range of motion. No rigidity.  Lymphadenopathy:     Cervical: No cervical adenopathy.  Neurological:     Mental Status: She is alert.      UC Treatments / Results  Labs (all labs ordered are listed, but only abnormal results are displayed) Labs Reviewed - No data to display  EKG   Radiology No results found.  Procedures Procedures (including  critical care time)  Medications Ordered in UC  Medications - No data to display  Initial Impression / Assessment and Plan / UC Course  I have reviewed the triage vital signs and the nursing notes.  Pertinent labs & imaging results that were available during my care of the patient were reviewed by me and considered in my medical decision making (see chart for details).     Wound recheck - well healing abscess. The remaining ulceration appears to be healing, is very shallow. No indication for iodoform packing. Pt has two days left of PO doxycycline. Will add topical mupirocin. Stop the hydrogel burn dressing.   Final Clinical Impressions(s) / UC Diagnoses   Final diagnoses:  Visit for wound check  Wound check, abscess     Discharge Instructions      Overall, your wound appears to be healing very well. Please complete all of the doxycycline until gone. Please stop the burn pads. Use the mupirocin ointment called in today over the affected area 3 times daily until fully closed. Use the nonadherent pads, or Telfa pads, over the area during the day, leave open at night.   ED Prescriptions     Medication Sig Dispense Auth. Provider   mupirocin ointment (BACTROBAN) 2 % Apply 1 Application topically 3 (three) times daily. 22 g Ephraim Reichel L, [place]      PDMP not reviewed this encounter.   Maretta Bees, [place] 12/16/22 1329

## 2022-12-16 NOTE — Discharge Instructions (Signed)
Overall, your wound appears to be healing very well. Please complete all of the doxycycline until gone. Please stop the burn pads. Use the mupirocin ointment called in today over the affected area 3 times daily until fully closed. Use the nonadherent pads, or Telfa pads, over the area during the day, leave open at night.

## 2022-12-16 NOTE — ED Triage Notes (Signed)
Seen last Tuesday for abscess to right breast, put on abx. Has been taking abx and doing warm compresses. Used too hot of water and scalded self. Has been putting aloe vera and vaseline on the area. Since Thursday morning has been draining pus.

## 2023-01-27 ENCOUNTER — Other Ambulatory Visit: Payer: Self-pay | Admitting: Physician Assistant

## 2023-02-20 ENCOUNTER — Telehealth: Payer: Self-pay

## 2023-02-20 DIAGNOSIS — I2699 Other pulmonary embolism without acute cor pulmonale: Secondary | ICD-10-CM

## 2023-02-20 NOTE — Telephone Encounter (Signed)
Copied from CRM [id number]. Topic: Clinical - Medication Question >> Feb 19, 2023  3:36 PM Suzette B wrote: Reason for CRM: Patient called in needs to speak to someone in the office in regard to the eloquist she was on for the blood clot she needed to know if she should continue taking it or go ahead and stop the medication.

## 2023-02-20 NOTE — Telephone Encounter (Signed)
Should she continue or stop Eliquis?

## 2023-02-20 NOTE — Telephone Encounter (Signed)
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  She has completed 6 months of anticoagulation therapy by now and can safely stop the Eliquis.  At her last appointment, she reported that she was very nervous to consider stopping the medication but at this point, it would be considered safe to do so.  ___________________________________________ Thayer Ohm, DNP, APRN, FNP-BC Primary Care and Sports Medicine [facility]

## 2023-02-21 NOTE — Telephone Encounter (Signed)
Per patient, she wants to do another imaging test to make sure she is cleared of any PE. She is reluctant to stop the medication without additional testing confirming she no longer has a clot. Please advise, thanks.

## 2023-02-22 NOTE — Telephone Encounter (Signed)
Task completed. No Berkley Harvey is required for STAT order.

## 2023-02-22 NOTE — Addendum Note (Signed)
Addended byChristen Butter on: 02/22/2023 01:22 PM   Modules accepted: Orders

## 2023-02-22 NOTE — Addendum Note (Signed)
Addended byChristen Butter on: 02/22/2023 02:17 PM   Modules accepted: Orders

## 2023-02-22 NOTE — Telephone Encounter (Signed)
CTA chest ordered.

## 2023-02-24 ENCOUNTER — Encounter (HOSPITAL_BASED_OUTPATIENT_CLINIC_OR_DEPARTMENT_OTHER): Payer: Self-pay | Admitting: *Deleted

## 2023-02-24 ENCOUNTER — Ambulatory Visit (HOSPITAL_BASED_OUTPATIENT_CLINIC_OR_DEPARTMENT_OTHER)
Admission: RE | Admit: 2023-02-24 | Discharge: 2023-02-24 | Discharge status: Home / Self Care | Payer: 59 | Source: Ambulatory Visit | Attending: Medical-Surgical

## 2023-02-24 DIAGNOSIS — I2699 Other pulmonary embolism without acute cor pulmonale: Secondary | ICD-10-CM

## 2023-02-24 MED ORDER — IOHEXOL 350 MG/ML SOLN
75.0000 mL | Freq: Once | INTRAVENOUS | Status: AC | PRN
  Administered 2023-02-24: 75 mL via INTRAVENOUS

## 2023-03-18 ENCOUNTER — Other Ambulatory Visit: Payer: Self-pay

## 2023-03-18 ENCOUNTER — Encounter: Payer: Self-pay | Admitting: Emergency Medicine

## 2023-03-18 ENCOUNTER — Ambulatory Visit
Admission: EM | Admit: 2023-03-18 | Discharge: 2023-03-18 | Disposition: A | Discharge status: Home / Self Care | Payer: 59 | Attending: Family Medicine | Admitting: Family Medicine

## 2023-03-18 DIAGNOSIS — J069 Acute upper respiratory infection, unspecified: Secondary | ICD-10-CM | POA: Diagnosis not present

## 2023-03-18 DIAGNOSIS — R52 Pain, unspecified: Secondary | ICD-10-CM

## 2023-03-18 LAB — POC SARS CORONAVIRUS 2 AG -  ED: SARS Coronavirus 2 Ag: NEGATIVE

## 2023-03-18 LAB — POCT INFLUENZA A/B
Influenza A, POC: NEGATIVE
Influenza B, POC: NEGATIVE

## 2023-03-18 MED ORDER — PREDNISONE 20 MG PO TABS: ORAL_TABLET | ORAL | 0 refills | Status: DC

## 2023-03-18 MED ORDER — BENZONATATE 200 MG PO CAPS: 200.0000 mg | ORAL_CAPSULE | Freq: Three times a day (TID) | ORAL | 0 refills | Status: AC | PRN

## 2023-03-18 NOTE — ED Provider Notes (Signed)
[name]    CSN: [id number] Arrival date & time: 03/18/23  0959      History   Chief Complaint Chief Complaint  Patient presents with   Cough   Generalized Body Aches    HPI BRYON OLDEN is a 47 y.o. female.   HPI Pleasant 47 year old female presents with cough and generalized body aches for 2 days.  PMH significant for obesity, HTN, and sarcoidosis.  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   Dry eyes, bilateral 09/30/2018   Myopia of both eyes 09/30/2018   Regular astigmatism of both eyes 09/30/2018   Cervical polyp 05/31/2018   Myofascial pain 05/31/2018   Lichen simplex chronicus 05/01/2018   Chronic fatigue 05/01/2018   Hypercalcemia 04/29/2018   Iron deficiency anemia 04/29/2018   Vitamin D deficiency 04/26/2018   Hypertension goal BP (blood pressure) < 130/80 04/26/2018   Sarcoidosis 09/09/2014    Past Surgical History:  Procedure Laterality Date   BREAST SURGERY     Breast reduction   NO PAST SURGERIES     TOTAL VAGINAL HYSTERECTOMY     VAGINAL HYSTERECTOMY Bilateral 12/10/2018   Procedure: HYSTERECTOMY VAGINAL WITH SALPINGECTOMY;  Surgeon: Allie Bossier, MD;  Location: [facility] OR;  Service: Gynecology;  Laterality: Bilateral;    OB History     Gravida  5   Para  2   Term      Preterm      AB  3   Living  2      SAB  1   IAB  2   Ectopic      Multiple      Live Births               Home Medications    Prior to Admission medications   Medication Sig Start Date End Date Taking? Authorizing Provider  amLODipine (NORVASC) 10 MG tablet Take 1  tablet (10 mg total) by mouth daily. 05/12/22  Yes Christen Butter, NP  benzonatate (TESSALON) 200 MG capsule Take 1 capsule (200 mg total) by mouth 3 (three) times daily as needed for up to 7 days. 03/18/23 03/25/23 Yes Trevor Iha, FNP  predniSONE (DELTASONE) 20 MG tablet Take 3 tabs PO daily x 5 days. 03/18/23  Yes Trevor Iha, FNP  Vitamin D, Ergocalciferol, (DRISDOL) 1.25 MG (50000 UNIT) CAPS capsule TAKE 1 CAPSULE (50,000 UNITS TOTAL) BY MOUTH EVERY 7 (SEVEN) DAYS 01/29/23  Yes Breeback, Jade L, PA-C  apixaban (ELIQUIS) 5 MG TABS tablet Take 1 tablet (5 mg total) by mouth 2 (two) times daily. 08/04/22   Christen Butter, NP  clotrimazole-betamethasone (LOTRISONE) cream Apply 1 application topically 2 (two) times daily. 05/20/21   Monica Becton, MD  cyclobenzaprine (FLEXERIL) 10 MG tablet Take 1 tablet (10 mg total) by mouth at bedtime. 05/20/21   Monica Becton, MD  doxycycline (VIBRAMYCIN) 100 MG capsule Take 1 capsule (100 mg total) by mouth 2 (two) times daily. 12/08/22   Elson Areas, PA-C  mupirocin ointment (BACTROBAN) 2 % Apply 1 Application topically 3 (three) times daily. 12/16/22   Verlon Setting,  Whitney L, PA  pantoprazole (PROTONIX) 40 MG tablet Take 1 tablet (40 mg total) by mouth daily. 05/12/21   Christen Butter, NP  spironolactone (ALDACTONE) 25 MG tablet Take 0.5-1 tablets (12.5-25 mg total) by mouth daily as needed (lower extremity edema). 11/10/22   Christen Butter, NP  triamcinolone ointment (KENALOG) 0.1 % Apply 1 application topically 2 (two) times daily. To affected areas 05/12/21   Christen Butter, NP    Family History Family History  Problem Relation Age of Onset   Hypertension Mother    Healthy Father     Social History Social History   Tobacco Use   Smoking status: Some Days    Types: Cigarettes   Smokeless tobacco: Never   Tobacco comments:    1-2 cig/day  Vaping Use   Vaping status: Never Used  Substance Use Topics   Alcohol use: Yes    Alcohol/week: 1.0 - 2.0  standard drink of alcohol    Types: 1 Glasses of wine per week    Comment: socially   Drug use: Never     Allergies   Blueberry [vaccinium angustifolium]   Review of Systems Review of Systems  HENT:  Positive for congestion.   Respiratory:  Positive for cough.   Musculoskeletal:  Positive for arthralgias and myalgias.  All other systems reviewed and are negative.    Physical Exam Triage Vital Signs ED Triage Vitals [03/18/23 1121]  Encounter Vitals Group     BP      Systolic BP Percentile      Diastolic BP Percentile      Pulse      Resp      Temp      Temp src      SpO2      Weight      Height      Head Circumference      Peak Flow      Pain Score 3     Pain Loc      Pain Education      Exclude from Growth Chart    No data found.  Updated Vital Signs BP 109/73 (BP Location: Right Arm)   Pulse (!) 102   Temp 99.6 F (37.6 C) (Oral)   Resp 16   LMP 11/19/2018   SpO2 97%    Physical Exam Vitals and nursing note reviewed.  Constitutional:      Appearance: Normal appearance. She is obese.  HENT:     Head: Normocephalic and atraumatic.     Right Ear: Tympanic membrane, ear canal and external ear normal.     Left Ear: Tympanic membrane, ear canal and external ear normal.     Mouth/Throat:     Mouth: Mucous membranes are moist.     Pharynx: Oropharynx is clear.  Eyes:     Extraocular Movements: Extraocular movements intact.     Conjunctiva/sclera: Conjunctivae normal.     Pupils: Pupils are equal, round, and reactive to light.  Cardiovascular:     Rate and Rhythm: Normal rate and regular rhythm.     Pulses: Normal pulses.     Heart sounds: Normal heart sounds.  Pulmonary:     Effort: Pulmonary effort is normal.     Breath sounds: Normal breath sounds. No wheezing, rhonchi or rales.     Comments: Infrequent nonproductive cough on exam Musculoskeletal:        General: Normal range of motion.     Cervical back: Normal range of motion and neck  supple.  Skin:    General: Skin is warm and dry.  Neurological:     General: No focal deficit present.     Mental Status: She is alert and oriented to person, place, and time. Mental status is at baseline.  Psychiatric:        Mood and Affect: Mood normal.        Behavior: Behavior normal.      UC Treatments / Results  Labs (all labs ordered are listed, but only abnormal results are displayed) Labs Reviewed  POCT INFLUENZA A/B  POC SARS CORONAVIRUS 2 AG -  ED    EKG   Radiology No results found.  Procedures Procedures (including critical care time)  Medications Ordered in UC Medications - No data to display  Initial Impression / Assessment and Plan / UC Course  I have reviewed the triage vital signs and the nursing notes.  Pertinent labs & imaging results that were available during my care of the patient were reviewed by me and considered in my medical decision making (see chart for details).     MDM: 1.  Cough, unspecified type-Rx'd prednisone 20 mg tablet: Take 3 tablets p.o. daily x 5 days, Rx'd Tessalon 200 mg capsule: Take 1 capsule 3 times daily, as needed for cough; 2.  Generalized bodyaches-influenza and COVID-19 both negative. Advised patient influenza and COVID-19 were both negative today.  Advised patient to take medication as directed with food to completion.  Advised may use Tessalon capsules daily or as needed for cough.  Encouraged to increase daily water intake to 64 ounces per day while taking these medications.  Advised if symptoms worsen and/or unresolved please follow-up with PCP or here for further evaluation. Final Clinical Impressions(s) / UC Diagnoses   Final diagnoses:  Generalized body aches  Viral URI with cough     Discharge Instructions      Advised patient influenza and COVID-19 were both negative today.  Advised patient to take medication as directed with food to completion.  Advised may use Tessalon capsules daily or as needed for  cough.  Encouraged to increase daily water intake to 64 ounces per day while taking these medications.  Advised if symptoms worsen and/or unresolved please follow-up with PCP or here for further evaluation.     ED Prescriptions     Medication Sig Dispense Auth. Provider   predniSONE (DELTASONE) 20 MG tablet Take 3 tabs PO daily x 5 days. 15 tablet Trevor Iha, FNP   benzonatate (TESSALON) 200 MG capsule Take 1 capsule (200 mg total) by mouth 3 (three) times daily as needed for up to 7 days. 40 capsule Trevor Iha, FNP      PDMP not reviewed this encounter.   Trevor Iha, FNP 03/18/23 1256

## 2023-03-18 NOTE — Discharge Instructions (Addendum)
Advised patient influenza and COVID-19 were both negative today.  Advised patient to take medication as directed with food to completion.  Advised may use Tessalon capsules daily or as needed for cough.  Encouraged to increase daily water intake to 64 ounces per day while taking these medications.  Advised if symptoms worsen and/or unresolved please follow-up with PCP or here for further evaluation.

## 2023-03-18 NOTE — ED Triage Notes (Signed)
Patient presents to Urgent Care with complaints of cough, body aches, headache, fatigued since 2 days ago. Patient reports went to [facility] to visit family for Christmas. Took Tylenol for body aches.

## 2023-05-14 ENCOUNTER — Ambulatory Visit (INDEPENDENT_AMBULATORY_CARE_PROVIDER_SITE_OTHER): Payer: 59

## 2023-05-14 ENCOUNTER — Ambulatory Visit
Admission: RE | Admit: 2023-05-14 | Discharge: 2023-05-14 | Disposition: A | Discharge status: Home / Self Care | Payer: 59 | Source: Ambulatory Visit | Attending: Physician Assistant | Admitting: Physician Assistant

## 2023-05-14 ENCOUNTER — Other Ambulatory Visit: Payer: Self-pay

## 2023-05-14 VITALS — BP 119/77 | HR 69 | Temp 98.4°F | Resp 16

## 2023-05-14 DIAGNOSIS — R051 Acute cough: Secondary | ICD-10-CM | POA: Diagnosis not present

## 2023-05-14 DIAGNOSIS — J40 Bronchitis, not specified as acute or chronic: Secondary | ICD-10-CM

## 2023-05-14 DIAGNOSIS — J9801 Acute bronchospasm: Secondary | ICD-10-CM | POA: Diagnosis not present

## 2023-05-14 DIAGNOSIS — R059 Cough, unspecified: Secondary | ICD-10-CM | POA: Diagnosis not present

## 2023-05-14 DIAGNOSIS — R062 Wheezing: Secondary | ICD-10-CM

## 2023-05-14 DIAGNOSIS — J329 Chronic sinusitis, unspecified: Secondary | ICD-10-CM

## 2023-05-14 MED ORDER — PROMETHAZINE-DM 6.25-15 MG/5ML PO SYRP: 5.0000 mL | ORAL_SOLUTION | Freq: Three times a day (TID) | ORAL | 0 refills | Status: DC | PRN

## 2023-05-14 MED ORDER — ALBUTEROL SULFATE HFA 108 (90 BASE) MCG/ACT IN AERS
1.0000 | INHALATION_SPRAY | Freq: Four times a day (QID) | RESPIRATORY_TRACT | 0 refills | Status: AC | PRN
Start: 2023-05-14 — End: ?

## 2023-05-14 MED ORDER — IPRATROPIUM-ALBUTEROL 0.5-2.5 (3) MG/3ML IN SOLN
3.0000 mL | Freq: Once | RESPIRATORY_TRACT | Status: AC
  Administered 2023-05-14: 3 mL via RESPIRATORY_TRACT

## 2023-05-14 MED ORDER — AMOXICILLIN-POT CLAVULANATE 875-125 MG PO TABS: 1.0000 | ORAL_TABLET | Freq: Two times a day (BID) | ORAL | 0 refills | Status: DC

## 2023-05-14 MED ORDER — METHYLPREDNISOLONE SODIUM SUCC 40 MG IJ SOLR
60.0000 mg | Freq: Once | INTRAMUSCULAR | Status: AC
Start: 2023-05-14 — End: 2023-05-14
  Administered 2023-05-14: 60 mg via INTRAMUSCULAR

## 2023-05-14 MED ORDER — PREDNISONE 20 MG PO TABS: 40.0000 mg | ORAL_TABLET | Freq: Every day | ORAL | 0 refills | Status: AC

## 2023-05-14 NOTE — ED Provider Notes (Signed)
 [facility]    CSN: [id number] Arrival date & time: 05/14/23  1051      History   Chief Complaint Chief Complaint  Patient presents with   Cough    Congestion  and possible sinus infection. - Entered by patient    HPI Marissa Chaney is a 48 y.o. female.   Patient presents today with a 6-day history of URI symptoms.  She reports nasal congestion, cough, sore throat, postnasal drainage.  Denies any fever, chest pain, shortness of breath.  She has been taking ginger routinely, benzonatate from a previous prescription, cough drops without improvement of symptoms.  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  She is confident that she has had COVID several times in the past but does not member the last time as she often does not test.  She denies any recent antibiotics or steroids.  She does have a history of sarcoidosis and is concerned that this could be flared as a result of illness.  She denies history of asthma, allergies, COPD.  She does have a history of PE but this was provoked following breast reduction surgery in April 2024 and she is no longer anticoagulated.    Past Medical History:  Diagnosis Date   Cervical polyp 05/31/2018   Hypertension    Iron deficiency anemia 04/29/2018   Pulmonary embolism [facility])    Sarcoidosis     Patient Active Problem List   Diagnosis Date Noted   Pulmonary embolus (HCC) 08/04/2022   Thyroid nodule 08/04/2022   Cortical age-related cataract, both eyes 05/15/2022   Enlargement of both lacrimal glands 05/15/2022   Macromastia 05/20/2021   Dry eyes, bilateral 09/30/2018   Myopia of both eyes 09/30/2018   Regular astigmatism of both eyes 09/30/2018   Cervical polyp 05/31/2018   Myofascial pain 05/31/2018   Lichen simplex chronicus 05/01/2018   Chronic fatigue 05/01/2018   Hypercalcemia 04/29/2018   Iron deficiency anemia 04/29/2018   Vitamin D deficiency 04/26/2018    Hypertension goal BP (blood pressure) < 130/80 04/26/2018   Sarcoidosis 09/09/2014    Past Surgical History:  Procedure Laterality Date   BREAST SURGERY     Breast reduction   NO PAST SURGERIES     TOTAL VAGINAL HYSTERECTOMY     VAGINAL HYSTERECTOMY Bilateral 12/10/2018   Procedure: HYSTERECTOMY VAGINAL WITH SALPINGECTOMY;  Surgeon: Allie Bossier, MD;  Location: [facility] OR;  Service: Gynecology;  Laterality: Bilateral;    OB History     Gravida  5   Para  2   Term      Preterm      AB  3   Living  2      SAB  1   IAB  2   Ectopic      Multiple      Live Births               Home Medications    Prior to Admission medications   Medication Sig Start Date End Date Taking? Authorizing Provider  albuterol (VENTOLIN HFA) 108 (90 Base) MCG/ACT inhaler Inhale 1-2 puffs into the lungs every 6 (six) hours as needed for wheezing or shortness of breath. 05/14/23  Yes Win Guajardo K, PA-C  amoxicillin-clavulanate (AUGMENTIN) 875-125 MG tablet Take 1 tablet by mouth every 12 (twelve) hours. 05/14/23  Yes Shadell Brenn K, PA-C  predniSONE (DELTASONE) 20 MG tablet Take 2 tablets (40 mg total) by  mouth daily for 5 days. 05/14/23 05/19/23 Yes Liat Mayol K, PA-C  promethazine-dextromethorphan (PROMETHAZINE-DM) 6.25-15 MG/5ML syrup Take 5 mLs by mouth 3 (three) times daily as needed for cough. 05/14/23  Yes Jeorgia Helming K, PA-C  amLODipine (NORVASC) 10 MG tablet Take 1 tablet (10 mg total) by mouth daily. 05/12/22   Christen Butter, NP  Vitamin D, Ergocalciferol, (DRISDOL) 1.25 MG (50000 UNIT) CAPS capsule TAKE 1 CAPSULE (50,000 UNITS TOTAL) BY MOUTH EVERY 7 (SEVEN) DAYS 01/29/23   Jomarie Longs, PA-C    Family History Family History  Problem Relation Age of Onset   Hypertension Mother    Healthy Father     Social History Social History   Tobacco Use   Smoking status: Some Days    Types: Cigarettes   Smokeless tobacco: Never   Tobacco comments:    1-2 cig/day  Vaping Use    Vaping status: Never Used  Substance Use Topics   Alcohol use: Yes    Alcohol/week: 1.0 - 2.0 standard drink of alcohol    Types: 1 Glasses of wine per week    Comment: socially   Drug use: Never     Allergies   Blueberry [vaccinium angustifolium]   Review of Systems Review of Systems  Constitutional:  Positive for activity change. Negative for appetite change, fatigue and fever.  HENT:  Positive for congestion, postnasal drip, sinus pressure and sore throat. Negative for sneezing.   Respiratory:  Positive for cough. Negative for shortness of breath.   Cardiovascular:  Negative for chest pain.  Gastrointestinal:  Negative for abdominal pain, diarrhea, nausea and vomiting.  Neurological:  Negative for dizziness, light-headedness and headaches.     Physical Exam Triage Vital Signs ED Triage Vitals  Encounter Vitals Group     BP 05/14/23 1059 119/77     Systolic BP Percentile --      Diastolic BP Percentile --      Pulse Rate 05/14/23 1059 69     Resp 05/14/23 1059 16     Temp 05/14/23 1059 98.4 F (36.9 C)     Temp src --      SpO2 05/14/23 1059 100 %     Weight --      Height --      Head Circumference --      Peak Flow --      Pain Score 05/14/23 1105 3     Pain Loc --      Pain Education --      Exclude from Growth Chart --    No data found.  Updated Vital Signs BP 119/77   Pulse 69   Temp 98.4 F (36.9 C)   Resp 16   LMP 11/19/2018   SpO2 100%   Visual Acuity Right Eye Distance:   Left Eye Distance:   Bilateral Distance:    Right Eye Near:   Left Eye Near:    Bilateral Near:     Physical Exam Vitals reviewed.  Constitutional:      General: She is awake. She is not in acute distress.    Appearance: Normal appearance. She is well-developed. She is not ill-appearing.     Comments: Very pleasant female appears stated age in no acute distress sitting comfortably in exam room  HENT:     Head: Normocephalic and atraumatic.     Right Ear: Tympanic  membrane, ear canal and external ear normal. Tympanic membrane is not erythematous or bulging.     Left Ear:  Tympanic membrane, ear canal and external ear normal. Tympanic membrane is not erythematous or bulging.     Nose:     Right Sinus: Maxillary sinus tenderness present. No frontal sinus tenderness.     Left Sinus: Maxillary sinus tenderness present. No frontal sinus tenderness.     Mouth/Throat:     Pharynx: Uvula midline. Postnasal drip present. No oropharyngeal exudate or posterior oropharyngeal erythema.  Cardiovascular:     Rate and Rhythm: Normal rate and regular rhythm.     Heart sounds: Normal heart sounds, S1 normal and S2 normal. No murmur heard. Pulmonary:     Effort: Pulmonary effort is normal.     Breath sounds: Wheezing present. No rhonchi or rales.     Comments: Widespread wheezing throughout lung fields Psychiatric:        Behavior: Behavior is cooperative.      UC Treatments / Results  Labs (all labs ordered are listed, but only abnormal results are displayed) Labs Reviewed - No data to display  EKG   Radiology No results found.  Procedures Procedures (including critical care time)  Medications Ordered in UC Medications  ipratropium-albuterol (DUONEB) 0.5-2.5 (3) MG/3ML nebulizer solution 3 mL (3 mLs Nebulization Given 05/14/23 1152)  methylPREDNISolone sodium succinate (SOLU-MEDROL) 40 mg/mL injection 60 mg (60 mg Intramuscular Given 05/14/23 1141)    Initial Impression / Assessment and Plan / UC Course  I have reviewed the triage vital signs and the nursing notes.  Pertinent labs & imaging results that were available during my care of the patient were reviewed by me and considered in my medical decision making (see chart for details).     Patient is well-appearing, afebrile, nontoxic, nontachycardic.  She has been symptomatic for almost a week and so viral testing was deferred as this would not change our management.  She is significant wheezing  that did not improve following a DuoNeb and 60 mg of Solu-Medrol in clinic.  Chest x-ray was obtained that showed no acute cardiopulmonary disease based on my primary read.  At the time of discharge we were waiting for radiologist over read and we will contact her if this differs and changes our treatment plan.  Low suspicion for PE given she is nontachycardic with an oxygen saturation of 100% with no significant recent risk factors, however, given her history of having a provoked PE we discussed that if her symptoms are not improving or if anything changes and she develops chest pain, shortness of breath, palpitations she needs to be seen emergently.  Will cover for sinobronchitis with Augmentin twice daily for 7 days.  She was started on prednisone burst of 40 mg for 5 days starting tomorrow given injection of Solu-Medrol today and we discussed that she is not to take NSAIDs with this medication and risk of GI bleeding.  She was given Promethazine DM for cough we discussed that this can be sedating and she is not to drive or drink alcohol with taking it.  She can use albuterol every 4-6 hours as needed for shortness of breath coughing fits.  Recommend close follow-up with her primary care ideally later this week.  We discussed that if anything changes or worsens and she must be seen immediately including chest pain, shortness of breath, palpitations, worsening cough.  All questions answered to her satisfaction.  Work excuse note provided.  Final Clinical Impressions(s) / UC Diagnoses   Final diagnoses:  Acute cough  Sinobronchitis  Bronchospasm     Discharge Instructions  I did not see anything on your x-ray but will call you if the radiologist sees something I did not and need to change you plan, I will call you. Start albuterol every 4-6 hours as needed. Start prednisone 40 mg for 5 days starting tomorrow (05/15/2023). Do not take any NSAIDs with this medication including aspirin,  ibuprofen/Advil, naproxen/aleve with this medication. You can use acetaminophen/Tylenol. Start Augmentin twice a day for a week. Use Mucinex and flonase for additional symptom relief. If symptoms are not improving within 3-5 days please return. If anything worsens and you have fever, worsening cough, shortness of breath, weakness you need to be seen immediately.       ED Prescriptions     Medication Sig Dispense Auth. Provider   albuterol (VENTOLIN HFA) 108 (90 Base) MCG/ACT inhaler Inhale 1-2 puffs into the lungs every 6 (six) hours as needed for wheezing or shortness of breath. 18 g Keagan Anthis K, PA-C   predniSONE (DELTASONE) 20 MG tablet Take 2 tablets (40 mg total) by mouth daily for 5 days. 10 tablet Joseguadalupe Stan K, PA-C   amoxicillin-clavulanate (AUGMENTIN) 875-125 MG tablet Take 1 tablet by mouth every 12 (twelve) hours. 14 tablet Olusegun Gerstenberger K, PA-C   promethazine-dextromethorphan (PROMETHAZINE-DM) 6.25-15 MG/5ML syrup Take 5 mLs by mouth 3 (three) times daily as needed for cough. 118 mL Elodie Panameno K, PA-C      PDMP not reviewed this encounter.   Jeani Hawking, PA-C 05/14/23 1231

## 2023-05-14 NOTE — ED Triage Notes (Addendum)
 Woke up thursday with coughing. Later got nasal congestion, ha, chest congestion, questionable diarrhea this morning. Has been having coughing spells. Has not had fever. Has used vick's cough drops, benzonatate.

## 2023-05-14 NOTE — Discharge Instructions (Addendum)
 I did not see anything on your x-ray but will call you if the radiologist sees something I did not and need to change you plan, I will call you. Start albuterol every 4-6 hours as needed. Start prednisone 40 mg for 5 days starting tomorrow (05/15/2023). Do not take any NSAIDs with this medication including aspirin, ibuprofen/Advil, naproxen/aleve with this medication. You can use acetaminophen/Tylenol. Start Augmentin twice a day for a week. Use Mucinex and flonase for additional symptom relief. If symptoms are not improving within 3-5 days please return. If anything worsens and you have fever, worsening cough, shortness of breath, weakness you need to be seen immediately.

## 2023-05-18 ENCOUNTER — Ambulatory Visit: Payer: 59 | Admitting: Medical-Surgical

## 2023-05-22 ENCOUNTER — Telehealth: Payer: Self-pay | Admitting: Family Medicine

## 2023-05-22 ENCOUNTER — Telehealth: Payer: Self-pay

## 2023-05-22 MED ORDER — FLUCONAZOLE 200 MG PO TABS: ORAL_TABLET | ORAL | 0 refills | Status: DC

## 2023-05-22 NOTE — Telephone Encounter (Signed)
 Patient requesting Diflucan as she was recently prescribed antibiotic for URI.  Diflucan sent to patient's pharmacy per her request.

## 2023-07-31 ENCOUNTER — Other Ambulatory Visit: Payer: Self-pay | Admitting: Physician Assistant

## 2023-07-31 ENCOUNTER — Other Ambulatory Visit: Payer: Self-pay | Admitting: Medical-Surgical

## 2023-08-26 ENCOUNTER — Other Ambulatory Visit: Payer: Self-pay | Admitting: Medical-Surgical

## 2023-08-27 ENCOUNTER — Ambulatory Visit: Payer: Self-pay

## 2023-08-27 NOTE — Telephone Encounter (Signed)
 Pls contact to reschedule cancelled due to Provider appt for HTN with [place]. Past due: Return in about 6 months (around 05/13/2023) for chronic disease follow up.

## 2023-08-27 NOTE — Telephone Encounter (Signed)
 Attempted call to patient. Left a detailed voice mail message on patient home # ( allowed on DPR )

## 2023-08-27 NOTE — Telephone Encounter (Signed)
 FYI Only or Action Required?: Action required by provider  Patient was last seen in primary care on 11/10/2022 by Cherre Cornish, NP. Called Nurse Triage reporting Chest Pain. Symptoms began a week ago. Interventions attempted: OTC medications: tylenol , Prescription medications: inhaler, and Other: compression socks. Symptoms are: intermittent left sided chest pain, anxiety, intermittent SOB, leg cramping, bilateral leg intermittent numbness stable.  Triage Disposition: Go to ED Now (Notify PCP)- Refused, called [facility] and notified staff  Patient/caregiver understands and will follow disposition?: No, refuses disposition                Copied from CRM [id number]. Topic: Clinical - Red Word Triage >> Aug 27, 2023  9:11 AM Kevelyn M wrote: Red Word that prompted transfer to Nurse Triage: For the past 2 weeks not feeling right/had a pulmonary embolism last year/chest feels funny/charlie horses/numbness in legs Reason for Disposition  History of prior "blood clot" in leg or lungs (i.e., deep vein thrombosis, pulmonary embolism)  Answer Assessment - Initial Assessment Questions 1. LOCATION: "Where does it hurt?"       Left breast.  2. RADIATION: "Does the pain go anywhere else?" (e.g., into neck, jaw, arms, back)     Denies radiating into neck/jaw/arm/back.  3. ONSET: "When did the chest pain begin?" (Minutes, hours or days)      Last week.  4. PATTERN: "Does the pain come and go, or has it been constant since it started?"  "Does it get worse with exertion?"      Comes and goes. Just the one episode last week.  5. DURATION: "How long does it last" (e.g., seconds, minutes, hours)     She states when she felt the pain last week it lasted a little over an hour.  6. SEVERITY: "How bad is the pain?"  (e.g., Scale 1-10; mild, moderate, or severe)    - MILD (1-3): doesn't interfere with normal activities     - MODERATE (4-7): interferes with normal activities or awakens from sleep    - SEVERE  (8-10): excruciating pain, unable to do any normal activities       No pain at present.  7. CARDIAC RISK FACTORS: "Do you have any history of heart problems or risk factors for heart disease?" (e.g., angina, prior heart attack; diabetes, high blood pressure, high cholesterol, smoker, or strong family history of heart disease)     Hypertension.  8. PULMONARY RISK FACTORS: "Do you have any history of lung disease?"  (e.g., blood clots in lung, asthma, emphysema, birth control pills)     Sarcoidosis, pulmonary embolism.  9. CAUSE: "What do you think is causing the chest pain?"     Patient states she is anxious because she had a PE in the past so she wants to make sure it is not a blood clot. She states she also thought it was keloid pain from a previous surgery. She states she thinks also maybe she had a panic attack.  10. OTHER SYMPTOMS: "Do you have any other symptoms?" (e.g., dizziness, nausea, vomiting, sweating, fever, difficulty breathing, cough)       Intermittent numbness in both legs "charlie horse" pains in legs, anxiety, intermittent SOB.  11. PREGNANCY: "Is there any chance you are pregnant?" "When was your last menstrual period?"       N/A.  Patient states "something just doesn't feel right".  Protocols used: Chest Pain-A-AH

## 2023-08-28 NOTE — Telephone Encounter (Signed)
 Spoke with patient. States she knows when she needs to go to ER and does not feel that it is at that point yet. She will go to ER if needed.

## 2023-09-07 ENCOUNTER — Other Ambulatory Visit: Payer: Self-pay | Admitting: Medical-Surgical

## 2023-09-23 ENCOUNTER — Other Ambulatory Visit: Payer: Self-pay | Admitting: Medical-Surgical

## 2023-09-24 ENCOUNTER — Other Ambulatory Visit: Payer: Self-pay | Admitting: Medical-Surgical

## 2023-09-24 NOTE — Telephone Encounter (Signed)
 Copied from CRM [id number]. Topic: Clinical - Medication Refill >> Sep 24, 2023  2:20 PM Merlynn A wrote: Medication: amLODipine  (NORVASC ) 10 MG tablet   Has the patient contacted their pharmacy? No (Agent: If no, request that the patient contact the pharmacy for the refill. If patient does not wish to contact the pharmacy document the reason why and proceed with request.) (Agent: If yes, when and what did the pharmacy advise?)  This is the patient's preferred pharmacy:  [phone] - [place], [place] - [address] [place]  [place] [address] Phone: [phone] Fax: [phone]  Is this the correct pharmacy for this prescription? Yes If no, delete pharmacy and type the correct one.   Has the prescription been filled recently? No  Is the patient out of the medication? Yes  Has the patient been seen for an appointment in the last year OR does the patient have an upcoming appointment? Yes  Can we respond through MyChart? Yes  Agent: Please be advised that Rx refills may take up to 3 business days. We ask that you follow-up with your pharmacy.

## 2023-09-27 NOTE — Telephone Encounter (Signed)
 Medication already refilled.

## 2023-10-01 ENCOUNTER — Encounter: Payer: Self-pay | Admitting: Medical-Surgical

## 2023-10-01 ENCOUNTER — Ambulatory Visit (INDEPENDENT_AMBULATORY_CARE_PROVIDER_SITE_OTHER): Admitting: Medical-Surgical

## 2023-10-01 VITALS — BP 128/78 | HR 63 | Ht 64.0 in | Wt 190.0 lb

## 2023-10-01 DIAGNOSIS — E559 Vitamin D deficiency, unspecified: Secondary | ICD-10-CM

## 2023-10-01 DIAGNOSIS — Z86711 Personal history of pulmonary embolism: Secondary | ICD-10-CM

## 2023-10-01 DIAGNOSIS — Z0000 Encounter for general adult medical examination without abnormal findings: Secondary | ICD-10-CM | POA: Diagnosis not present

## 2023-10-01 DIAGNOSIS — I10 Essential (primary) hypertension: Secondary | ICD-10-CM | POA: Diagnosis not present

## 2023-10-01 DIAGNOSIS — E010 Iodine-deficiency related diffuse (endemic) goiter: Secondary | ICD-10-CM

## 2023-10-01 DIAGNOSIS — L603 Nail dystrophy: Secondary | ICD-10-CM | POA: Diagnosis not present

## 2023-10-01 DIAGNOSIS — Z1211 Encounter for screening for malignant neoplasm of colon: Secondary | ICD-10-CM

## 2023-10-01 LAB — POCT URINALYSIS DIP (CLINITEK)
Bilirubin, UA: NEGATIVE
Blood, UA: NEGATIVE
Glucose, UA: NEGATIVE mg/dL
Ketones, POC UA: NEGATIVE mg/dL
Leukocytes, UA: NEGATIVE
Nitrite, UA: NEGATIVE
POC PROTEIN,UA: NEGATIVE
Spec Grav, UA: 1.01 (ref 1.010–1.025)
Urobilinogen, UA: 0.2 U/dL
pH, UA: 7 (ref 5.0–8.0)

## 2023-10-01 MED ORDER — AMLODIPINE BESYLATE 10 MG PO TABS: 10.0000 mg | ORAL_TABLET | Freq: Every day | ORAL | 3 refills | Status: AC

## 2023-10-01 MED ORDER — AMLODIPINE BESYLATE 10 MG PO TABS: 10.0000 mg | ORAL_TABLET | Freq: Every day | ORAL | 0 refills | Status: DC

## 2023-10-01 NOTE — Progress Notes (Signed)
 Complete physical exam  Patient: Marissa Chaney   DOB: 1976/02/11   48 y.o. Female  MRN: [id number]  Subjective:    Chief Complaint  Patient presents with   Annual Exam    Marissa Chaney is a 48 y.o. female who presents today for a complete physical exam. She reports consuming a vegan diet. The patient has a physically strenuous job, but has no regular exercise apart from work.  She generally feels fairly well. She reports sleeping poorly. She does have additional problems to discuss today.   Peripheral edema and lower extremity symptoms - Increased swelling on the left side, attributed to prolonged standing at work - Occasional muscle cramps and tingling in the back of the legs - History of pulmonary embolism with concern for swelling and shortness of breath - Ultrasound of legs on June 12th showed no clots - Wearing compression socks, but discomfort due to thick toenails  Respiratory symptoms - Shortness of breath, with use of inhaler as needed - No abnormalities on recent chest x-ray and blood work  Headache - Severe headache last week, improved with aspirin and Liquid IV - Suspected dehydration as contributing factor  Sleep disturbance - Insomnia, with sleep described as 'taking naps'  Weight gain and metabolic concerns - Weight gain attributed to fluid retention and lack of exercise - Concern about sugar intake and risk of diabetes due to preference for sweets  Abdominal pain - Sharp abdominal pains associated with significant stress from selling a house and moving  Musculoskeletal pain - Back pain with breathing, prompting ER visit on June 12th - Prescribed ibuprofen , muscle relaxers, and anxiety medication, but hesitant to take ibuprofen  due to embolism history  Ophthalmologic history - History of premature cataracts - Cataract surgery was considered last year but postponed due to embolism  Nail changes - Seeking podiatry evaluation for low circulation and  thick toenails   Most recent fall risk assessment:    11/10/2022    9:31 AM  Fall Risk   Falls in the past year? 0  Number falls in past yr: 0  Injury with Fall? 0  Risk for fall due to : No Fall Risks  Follow up Falls evaluation completed     Most recent depression screenings:    10/01/2023   10:30 AM 11/10/2022    9:32 AM  PHQ 2/9 Scores  PHQ - 2 Score 0 1    Vision:Within last year and Dental: No current dental problems and Receives regular dental care    Patient Care Team: Willo Mini, NP as PCP - General (Nurse Practitioner) Starla Harland BROCKS, MD as Consulting Physician (Obstetrics and Gynecology) Gustavus Simmonds, DO as Consulting Physician (Rheumatology)   Outpatient Medications Prior to Visit  Medication Sig   albuterol  (VENTOLIN  HFA) 108 (90 Base) MCG/ACT inhaler Inhale 1-2 puffs into the lungs every 6 (six) hours as needed for wheezing or shortness of breath.   Vitamin D , Ergocalciferol , (DRISDOL ) 1.25 MG (50000 UNIT) CAPS capsule TAKE 1 CAPSULE (50,000 UNITS TOTAL) BY MOUTH EVERY 7 (SEVEN) DAYS   [DISCONTINUED] amLODipine  (NORVASC ) 10 MG tablet Take 1 tablet (10 mg total) by mouth daily.   [DISCONTINUED] amoxicillin -clavulanate (AUGMENTIN ) 875-125 MG tablet Take 1 tablet by mouth every 12 (twelve) hours.   [DISCONTINUED] fluconazole  (DIFLUCAN ) 200 MG tablet Take 1 tab p.o. now, may repeat 1 tab p.o. in 3 days if symptoms are not resolved.   [DISCONTINUED] promethazine -dextromethorphan (PROMETHAZINE -DM) 6.25-15 MG/5ML syrup Take 5 mLs by mouth 3 (three)  times daily as needed for cough.   No facility-administered medications prior to visit.    Review of Systems  Constitutional:  Positive for malaise/fatigue. Negative for chills, fever and weight loss.  HENT:  Negative for congestion, ear pain, hearing loss, sinus pain and sore throat.   Eyes:  Negative for blurred vision, photophobia and pain.  Respiratory:  Negative for cough, shortness of breath and wheezing.    Cardiovascular:  Negative for chest pain, palpitations and leg swelling.  Gastrointestinal:  Negative for abdominal pain, constipation, diarrhea, heartburn, nausea and vomiting.  Genitourinary:  Negative for dysuria, frequency and urgency.  Musculoskeletal:  Negative for falls and neck pain.  Skin:  Negative for itching and rash.       Discolored, long toenails  Neurological:  Negative for dizziness, weakness and headaches.  Endo/Heme/Allergies:  Negative for polydipsia. Does not bruise/bleed easily.  Psychiatric/Behavioral:  Negative for depression, substance abuse and suicidal ideas. The patient has insomnia. The patient is not nervous/anxious.        Significant stress.     Objective:    BP 128/78   Pulse 63   Ht 5' 4 (1.626 m)   Wt 190 lb (86.2 kg)   LMP 11/19/2018   SpO2 99%   BMI 32.61 kg/m    Physical Exam Vitals reviewed.  Constitutional:      General: She is not in acute distress.    Appearance: Normal appearance. She is obese. She is not ill-appearing.  HENT:     Head: Normocephalic and atraumatic.     Right Ear: Tympanic membrane, ear canal and external ear normal. There is no impacted cerumen.     Left Ear: Tympanic membrane, ear canal and external ear normal. There is no impacted cerumen.     Nose: Nose normal. No congestion or rhinorrhea.     Mouth/Throat:     Mouth: Mucous membranes are moist.     Pharynx: No oropharyngeal exudate or posterior oropharyngeal erythema.  Eyes:     General: No scleral icterus.       Right eye: No discharge.        Left eye: No discharge.     Extraocular Movements: Extraocular movements intact.     Conjunctiva/sclera: Conjunctivae normal.     Pupils: Pupils are equal, round, and reactive to light.  Neck:     Thyroid : No thyromegaly.     Vascular: No carotid bruit or JVD.     Trachea: Trachea normal.  Cardiovascular:     Rate and Rhythm: Normal rate and regular rhythm.     Pulses: Normal pulses.     Heart sounds: Normal  heart sounds. No murmur heard.    No friction rub. No gallop.  Pulmonary:     Effort: Pulmonary effort is normal. No respiratory distress.     Breath sounds: Normal breath sounds. No wheezing.  Abdominal:     General: Bowel sounds are normal. There is no distension.     Palpations: Abdomen is soft.     Tenderness: There is no abdominal tenderness. There is no guarding.  Musculoskeletal:        General: Normal range of motion.     Cervical back: Normal range of motion and neck supple.  Lymphadenopathy:     Cervical: No cervical adenopathy.  Skin:    General: Skin is warm and dry.  Neurological:     Mental Status: She is alert and oriented to person, place, and time.     Cranial  Nerves: No cranial nerve deficit.  Psychiatric:        Mood and Affect: Mood normal.        Behavior: Behavior normal.        Thought Content: Thought content normal.        Judgment: Judgment normal.    Results for orders placed or performed in visit on 10/01/23  POCT URINALYSIS DIP (CLINITEK)  Result Value Ref Range   Color, UA yellow yellow   Clarity, UA clear clear   Glucose, UA negative negative mg/dL   Bilirubin, UA negative negative   Ketones, POC UA negative negative mg/dL   Spec Grav, UA 8.989 8.989 - 1.025   Blood, UA negative negative   pH, UA 7.0 5.0 - 8.0   POC PROTEIN,UA negative negative, trace   Urobilinogen, UA 0.2 0.2 or 1.0 E.U./dL   Nitrite, UA Negative Negative   Leukocytes, UA Negative Negative       Assessment & Plan:    Routine Health Maintenance and Physical Exam  Immunization History  Administered Date(s) Administered   Moderna Sars-Covid-2 Vaccination 01/01/2020, 01/29/2020   Tdap 05/13/2018    Health Maintenance  Topic Date Due   Pneumococcal Vaccine 47-22 Years old (1 of 2 - PCV) Never done   Hepatitis B Vaccines (1 of 3 - 19+ 3-dose series) Never done   Colonoscopy  Never done   COVID-19 Vaccine (3 - 2024-25 season) 11/19/2022   INFLUENZA VACCINE   10/19/2023   DTaP/Tdap/Td (2 - Td or Tdap) 05/13/2028   Hepatitis C Screening  Completed   HIV Screening  Completed   HPV VACCINES  Aged Out   Meningococcal B Vaccine  Aged Out    Discussed health benefits of physical activity, and encouraged her to engage in regular exercise appropriate for her age and condition.  Annual physical exam (Primary) Vegan diet with occasional dairy. Up to date on vision care, not on dental care. Concerns about weight gain, fluid retention, and dietary habits. Discussed exercise, reducing sugar, and monitoring sodium intake. - Encourage regular exercise and weight management. - Advise on reducing sugar intake. - Recommend dental check-up. - Order comprehensive metabolic panel, CBC, lipid panel, thyroid  panel, and vitamin D  level. - Order urinalysis.  2. Hypertension goal BP (blood pressure) < 130/80 Blood pressure will on amlodipine  10 mg daily.  Continue to monitor at home. - CBC with Differential/Platelet - Lipid panel - Comprehensive metabolic panel with GFR  3. Onychodystrophy Referring to podiatry. - Ambulatory referral to Podiatry  4. History of pulmonary embolism Per patient request, checking D-dimer and PTT/INR for reassurance after having bilateral leg swelling. - D-dimer, quantitative - CP4508-PT/INR AND PTT  5. Vitamin D  deficiency Checking vitamin D . - VITAMIN D  25 Hydroxy (Vit-D Deficiency, Fractures)  6. Colon cancer screening Referring to GI for colonoscopy. - Ambulatory referral to Gastroenterology  7. Thyromegaly Checking TSH. - TSH  Return in about 6 months (around 04/02/2024) for HTN follow up or sooner if needed.   Aracely Rickett, NP

## 2023-10-06 LAB — COMPREHENSIVE METABOLIC PANEL WITH GFR
AG Ratio: 1.7 (calc) (ref 1.0–2.5)
ALT: 10 U/L (ref 6–29)
AST: 16 U/L (ref 10–35)
Albumin: 4.1 g/dL (ref 3.6–5.1)
Alkaline phosphatase (APISO): 89 U/L (ref 31–125)
BUN: 8 mg/dL (ref 7–25)
CO2: 25 mmol/L (ref 20–32)
Calcium: 9.4 mg/dL (ref 8.6–10.2)
Chloride: 105 mmol/L (ref 98–110)
Creat: 0.59 mg/dL (ref 0.50–0.99)
Globulin: 2.4 g/dL (ref 1.9–3.7)
Glucose, Bld: 77 mg/dL (ref 65–99)
Potassium: 4.4 mmol/L (ref 3.5–5.3)
Sodium: 139 mmol/L (ref 135–146)
Total Bilirubin: 0.4 mg/dL (ref 0.2–1.2)
Total Protein: 6.5 g/dL (ref 6.1–8.1)
eGFR: 112 mL/min/1.73m2 (ref >=60)

## 2023-10-06 LAB — CBC WITH DIFFERENTIAL/PLATELET
Absolute Lymphocytes: 1758 {cells}/uL (ref 850–3900)
Absolute Monocytes: 494 {cells}/uL (ref 200–950)
Basophils Absolute: 73 {cells}/uL (ref 0–200)
Basophils Relative: 1.4 %
Eosinophils Absolute: 312 {cells}/uL (ref 15–500)
Eosinophils Relative: 6 %
HCT: 40.3 % (ref 35.0–45.0)
Hemoglobin: 13.1 g/dL (ref 11.7–15.5)
MCH: 31.8 pg (ref 27.0–33.0)
MCHC: 32.5 g/dL (ref 32.0–36.0)
MCV: 97.8 fL (ref 80.0–100.0)
MPV: 10.8 fL (ref 7.5–12.5)
Monocytes Relative: 9.5 %
Neutro Abs: 2564 {cells}/uL (ref 1500–7800)
Neutrophils Relative %: 49.3 %
Platelets: 324 Thousand/uL (ref 140–400)
RBC: 4.12 Million/uL (ref 3.80–5.10)
RDW: 12.7 % (ref 11.0–15.0)
Total Lymphocyte: 33.8 %
WBC: 5.2 Thousand/uL (ref 3.8–10.8)

## 2023-10-06 LAB — LIPID PANEL
Cholesterol: 251 mg/dL — ABNORMAL HIGH (ref <200)
HDL: 81 mg/dL (ref >=50)
LDL Cholesterol (Calc): 155 mg/dL — ABNORMAL HIGH
Non-HDL Cholesterol (Calc): 170 mg/dL — ABNORMAL HIGH (ref <130)
Total CHOL/HDL Ratio: 3.1 (calc) (ref <5.0)
Triglycerides: 57 mg/dL (ref <150)

## 2023-10-06 LAB — CP4508-PT/INR AND PTT
INR: 1
Prothrombin Time: 10.4 s (ref 9.0–11.5)
aPTT: 28 s (ref 23–32)

## 2023-10-06 LAB — TSH: TSH: 0.83 m[IU]/L

## 2023-10-06 LAB — D-DIMER, QUANTITATIVE: D-Dimer, Quant: 0.45 ug{FEU}/mL (ref <0.50)

## 2023-10-07 ENCOUNTER — Ambulatory Visit: Payer: Self-pay | Admitting: Medical-Surgical

## 2023-10-25 ENCOUNTER — Telehealth: Payer: Self-pay

## 2023-10-25 NOTE — Telephone Encounter (Signed)
 Left message on voicemail asking about recent mammogram.

## 2023-11-20 ENCOUNTER — Encounter: Payer: Self-pay | Admitting: Sports Medicine

## 2024-01-04 LAB — LAB REPORT - SCANNED: A1c: 5.3

## 2024-01-24 ENCOUNTER — Ambulatory Visit: Payer: Self-pay

## 2024-01-24 ENCOUNTER — Ambulatory Visit

## 2024-01-24 ENCOUNTER — Encounter: Payer: Self-pay | Admitting: Medical-Surgical

## 2024-01-24 ENCOUNTER — Ambulatory Visit: Payer: Self-pay | Admitting: Family Medicine

## 2024-01-24 ENCOUNTER — Encounter: Payer: Self-pay | Admitting: Family Medicine

## 2024-01-24 ENCOUNTER — Ambulatory Visit (INDEPENDENT_AMBULATORY_CARE_PROVIDER_SITE_OTHER): Admitting: Family Medicine

## 2024-01-24 VITALS — BP 127/80 | HR 76 | Ht 64.0 in | Wt 187.0 lb

## 2024-01-24 DIAGNOSIS — M7989 Other specified soft tissue disorders: Secondary | ICD-10-CM

## 2024-01-24 DIAGNOSIS — M79605 Pain in left leg: Secondary | ICD-10-CM | POA: Insufficient documentation

## 2024-01-24 NOTE — Assessment & Plan Note (Addendum)
 Some baseline swelling related to amlodipine .  Stat LE US  ordered to rule out DVT.

## 2024-01-24 NOTE — Telephone Encounter (Signed)
 FYI Only or Action Required?: FYI only for provider: appointment scheduled on 11/6.  Patient was last seen in primary care on 10/01/2023 by Willo Mini, NP.  Called Nurse Triage reporting Foot Swelling.  Symptoms began about a month ago.  Interventions attempted: Rest, hydration, or home remedies.  Symptoms are: gradually worsening.  Triage Disposition: See Physician Within 24 Hours  Patient/caregiver understands and will follow disposition?: Yes         Copied from CRM [id number]. Topic: Clinical - Red Word Triage >> Jan 24, 2024  8:16 AM Avram MATSU wrote: Red Word that prompted transfer to Nurse Triage: patient has been experiencing swelling on her right foot, some pain, not sure if she got bit because there is a spot on her foot. Stated the pain can be felt in her hip sometimes. Reason for Disposition  [1] MODERATE leg swelling (e.g., swelling extends up to knees) AND [2] new-onset or getting worse  Answer Assessment - Initial Assessment Questions 1. ONSET: When did the swelling start? (e.g., minutes, hours, days)     X 1 month getting progressively worse    2. LOCATION: What part of the leg is swollen?  Are both legs swollen or just one leg?     Left Foot, small dot by ankle noted  3. SEVERITY: How bad is the swelling? (e.g., localized; mild, moderate, severe)     Mild  4. REDNESS: Is there redness or signs of infection?     No   5. PAIN: Is the swelling painful to touch? If Yes, ask: How painful is it?   (Scale 1-10; mild, moderate or severe)     2/10  6. FEVER: Do you have a fever? If Yes, ask: What is it, how was it measured, and when did it start?      No   7. CAUSE: What do you think is causing the leg swelling?     Unsure   8. MEDICAL HISTORY: Do you have a history of blood clots (e.g., DVT), cancer, heart failure, kidney disease, or liver failure?     Pulmonary Embolism last year   9. RECURRENT SYMPTOM: Have you had leg swelling  before? If Yes, ask: When was the last time? What happened that time?      Yes, ongoing, getting worse over the past 1 month  10. OTHER SYMPTOMS: Do you have any other symptoms? (e.g., chest pain, difficulty breathing)  Nausea noted. Appt. Scheduled  Protocols used: Leg Swelling and Edema-A-AH

## 2024-01-24 NOTE — Progress Notes (Signed)
 Marissa Chaney - 48 y.o. female MRN [id number]  Date of birth: [birth year]  Subjective Chief Complaint  Patient presents with   Edema   Leg Pain    HPI Marissa Chaney is a 48 y.o. female here today with complaint of swelling in b/l legs L>R.  History of chronic swelling related to amlodipine . She has noticed more swelling in the L leg and foot with some pain in the lateral L leg.  She has had previous PE and has history of sarcoidosis.  She has not had chest pain, dyspnea, or palpitations.  No recent injury or overuse.   ROS:  A comprehensive ROS was completed and negative except as noted per HPI  Allergies  Allergen Reactions   Blueberry [Vaccinium Angustifolium] Nausea And Vomiting    Past Medical History:  Diagnosis Date   Cervical polyp 05/31/2018   Hypertension    Iron deficiency anemia 04/29/2018   Pulmonary embolism (HCC)    Sarcoidosis     Past Surgical History:  Procedure Laterality Date   BREAST SURGERY     Breast reduction   NO PAST SURGERIES     TOTAL VAGINAL HYSTERECTOMY     VAGINAL HYSTERECTOMY Bilateral 12/10/2018   Procedure: HYSTERECTOMY VAGINAL WITH SALPINGECTOMY;  Surgeon: Starla Harland BROCKS, MD;  Location: [facility] OR;  Service: Gynecology;  Laterality: Bilateral;    Social History   Socioeconomic History   Marital status: Married    Spouse name: Not on file   Number of children: Not on file   Years of education: Not on file   Highest education level: Not on file  Occupational History   Not on file  Tobacco Use   Smoking status: Some Days    Types: Cigarettes   Smokeless tobacco: Never   Tobacco comments:    1-2 cig/day  Vaping Use   Vaping status: Never Used  Substance and Sexual Activity   Alcohol use: Yes    Alcohol/week: 1.0 - 2.0 standard drink of alcohol    Types: 1 Glasses of wine per week    Comment: socially   Drug use: Never   Sexual activity: Yes    Birth control/protection: None  Other Topics Concern   Not on file  Social History  Narrative   Not on file   Social Drivers of Health   Financial Resource Strain: Low Risk  (12/10/2018)   Overall Financial Resource Strain (CARDIA)    Difficulty of Paying Living Expenses: Not hard at all  Food Insecurity: No Food Insecurity (07/26/2022)   Received from [facility]   Hunger Vital Sign    Within the past 12 months, you worried that your food would run out before you got the money to buy more.: Never true    Within the past 12 months, the food you bought just didn't last and you didn't have money to get more.: Never true  Transportation Needs: No Transportation Needs (07/26/2022)   Received from [facility] - Transportation    Lack of Transportation (Medical): No    Lack of Transportation (Non-Medical): No  Physical Activity: Inactive (12/10/2018)   Exercise Vital Sign    Days of Exercise per Week: 0 days    Minutes of Exercise per Session: 0 min  Stress: No Stress Concern Present (07/26/2022)   Received from [facility] - Occupational Stress Questionnaire    Feeling of Stress : Not at all  Social Connections: Unknown (06/21/2022)  Received from [facility]   Social Network    Social Network: Not on file    Family History  Problem Relation Age of Onset   Hypertension Mother    Healthy Father     Health Maintenance  Topic Date Due   Pneumococcal Vaccine (1 of 2 - PCV) Never done   Hepatitis B Vaccines 19-59 Average Risk (1 of 3 - 19+ 3-dose series) Never done   Colonoscopy  Never done   Mammogram  07/02/2022   Influenza Vaccine  Never done   COVID-19 Vaccine (3 - 2025-26 season) 11/19/2023   DTaP/Tdap/Td (2 - Td or Tdap) 05/13/2028   Hepatitis C Screening  Completed   HIV Screening  Completed   HPV VACCINES  Aged Out   Meningococcal B Vaccine  Aged Out      ----------------------------------------------------------------------------------------------------------------------------------------------------------------------------------------------------------------- Physical Exam BP 127/80 (BP Location: Left Arm, Patient Position: Sitting, Cuff Size: Normal)   Pulse 76   Ht 5' 4 (1.626 m)   Wt 187 lb (84.8 kg)   LMP 11/19/2018   SpO2 99%   BMI 32.10 kg/m   Physical Exam Constitutional:      Appearance: Normal appearance.  HENT:     Head: Normocephalic and atraumatic.  Eyes:     General: No scleral icterus. Cardiovascular:     Rate and Rhythm: Normal rate and regular rhythm.  Pulmonary:     Effort: Pulmonary effort is normal.     Breath sounds: Normal breath sounds.  Musculoskeletal:     Cervical back: Neck supple.     Comments: Trace RLE edema 1+ LLE edema.  Negative homan.  TTP along lateral leg.   Neurological:     Mental Status: She is alert.  Psychiatric:        Mood and Affect: Mood normal.        Behavior: Behavior normal.     ------------------------------------------------------------------------------------------------------------------------------------------------------------------------------------------------------------------- Assessment and Plan  Pain and swelling of left lower extremity Some baseline swelling related to amlodipine .  Stat LE US  ordered to rule out DVT.     No orders of the defined types were placed in this encounter.   No follow-ups on file.

## 2024-01-24 NOTE — Telephone Encounter (Signed)
 Patient scheduled today with Dr. Alvia.

## 2024-01-28 ENCOUNTER — Other Ambulatory Visit: Payer: Self-pay | Admitting: Physician Assistant

## 2024-01-31 ENCOUNTER — Telehealth: Payer: Self-pay

## 2024-01-31 NOTE — Telephone Encounter (Signed)
 Left message for a return call. She is due for a mammogram. Also for colon cancer screening.

## 2024-02-10 NOTE — Telephone Encounter (Signed)
 Entered in error

## 2024-04-04 ENCOUNTER — Telehealth: Payer: Self-pay

## 2024-04-04 ENCOUNTER — Ambulatory Visit
Admission: RE | Admit: 2024-04-04 | Discharge: 2024-04-04 | Disposition: A | Discharge status: Home / Self Care | Attending: Family Medicine | Admitting: Family Medicine

## 2024-04-04 ENCOUNTER — Other Ambulatory Visit: Payer: Self-pay

## 2024-04-04 ENCOUNTER — Ambulatory Visit: Admitting: Medical-Surgical

## 2024-04-04 VITALS — BP 128/90 | HR 72 | Temp 98.0°F | Resp 16

## 2024-04-04 DIAGNOSIS — J209 Acute bronchitis, unspecified: Secondary | ICD-10-CM | POA: Diagnosis not present

## 2024-04-04 MED ORDER — PREDNISONE 20 MG PO TABS: ORAL_TABLET | ORAL | 0 refills | Status: AC

## 2024-04-04 MED ORDER — AZITHROMYCIN 250 MG PO TABS: ORAL_TABLET | ORAL | 0 refills | Status: AC

## 2024-04-04 NOTE — ED Provider Notes (Signed)
 " [facility]    CSN: [id number] Arrival date & time: 04/04/24  0932      History   Chief Complaint Chief Complaint  Patient presents with   Nasal Congestion    Running nose and aches - Entered by patient    HPI Marissa Chaney is a 49 y.o. female.   Patient reports that she developed myalgias about a week ago, followed four days ago by sinus congestion, sore throat, myalgias and cough.  Yesterday she had fever 101 and last night some loose stools.  She has a history of sarcoidosis and pulmonary embolus.  Since having a PE two years ago, she often develops chest tightness and shortness of breath during a URI requiring the use of an albuterol  inhaler.  She reports that her sputum is now green/yellow.  The history is provided by the patient.    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   Pulmonary embolus (HCC) 08/04/2022   Thyroid  nodule 08/04/2022   Cataract of both eyes due to drug 05/15/2022   Enlargement of both lacrimal glands 05/15/2022   Macromastia 05/20/2021   Dry eyes, bilateral 09/30/2018   Myopia of both eyes 09/30/2018   Regular astigmatism of both eyes 09/30/2018   Cervical polyp 05/31/2018   Myofascial pain 05/31/2018   Lichen simplex chronicus 05/01/2018   Chronic fatigue 05/01/2018   Hypercalcemia 04/29/2018   Iron deficiency anemia 04/29/2018   Vitamin D  deficiency 04/26/2018   Hypertension goal BP (blood pressure) < 130/80 04/26/2018   Sarcoidosis 09/09/2014    Past Surgical History:  Procedure Laterality Date   BREAST SURGERY     Breast reduction   NO PAST SURGERIES     TOTAL VAGINAL HYSTERECTOMY     VAGINAL HYSTERECTOMY Bilateral 12/10/2018   Procedure: HYSTERECTOMY  VAGINAL WITH SALPINGECTOMY;  Surgeon: Starla Harland BROCKS, MD;  Location: [facility] OR;  Service: Gynecology;  Laterality: Bilateral;    OB History     Gravida  5   Para  2   Term      Preterm      AB  3   Living  2      SAB  1   IAB  2   Ectopic      Multiple      Live Births               Home Medications    Prior to Admission medications  Medication Sig Start Date End Date Taking? Authorizing Provider  azithromycin  (ZITHROMAX  Z-PAK) 250 MG tablet Take 2 tabs today; then begin one tab once daily for 4 more days. 04/04/24  Yes Pauline Garnette LABOR, MD  predniSONE  (DELTASONE ) 20 MG tablet Take one tab by mouth twice daily for 3 days, then one daily for 2 days. Take with food. 04/04/24  Yes Pauline Garnette LABOR, MD  albuterol  (VENTOLIN  HFA) 108 (90 Base) MCG/ACT inhaler Inhale 1-2 puffs into the lungs every 6 (six) hours as needed for wheezing or shortness of breath. 05/14/23   Raspet, Erin K, PA-C  amLODipine  (NORVASC ) 10 MG tablet Take 1 tablet (10 mg total) by mouth daily. 10/02/23   Willo Mini, NP  Vitamin D , Ergocalciferol , (DRISDOL ) 1.25  MG (50000 UNIT) CAPS capsule TAKE 1 CAPSULE (50,000 UNITS TOTAL) BY MOUTH EVERY 7 (SEVEN) DAYS 01/31/24   Antoniette Vermell CROME, PA-C    Family History Family History  Problem Relation Age of Onset   Hypertension Mother    Healthy Father     Social History Social History[1]   Allergies   Blueberry [vaccinium angustifolium]   Review of Systems Review of Systems + sore throat + cough No pleuritic pain No wheezing + nasal congestion + post-nasal drainage No sinus pain/pressure No itchy/red eyes No earache No hemoptysis + SOB + fever No nausea No vomiting No abdominal pain + diarrhea No urinary symptoms No skin rash + fatigue + myalgias + headache   Physical Exam Triage Vital Signs ED Triage Vitals  Encounter Vitals Group     BP 04/04/24 1017 (!) 128/90     Girls Systolic BP Percentile --      Girls Diastolic BP  Percentile --      Boys Systolic BP Percentile --      Boys Diastolic BP Percentile --      Pulse Rate 04/04/24 1017 72     Resp 04/04/24 1017 16     Temp 04/04/24 1017 98 F (36.7 C)     Temp src --      SpO2 04/04/24 1017 99 %     Weight --      Height --      Head Circumference --      Peak Flow --      Pain Score 04/04/24 1014 6     Pain Loc --      Pain Education --      Exclude from Growth Chart --    No data found.  Updated Vital Signs BP (!) 128/90   Pulse 72   Temp 98 F (36.7 C)   Resp 16   LMP 11/19/2018   SpO2 99%   Visual Acuity Right Eye Distance:   Left Eye Distance:   Bilateral Distance:    Right Eye Near:   Left Eye Near:    Bilateral Near:     Physical Exam Nursing notes and Vital Signs reviewed. Appearance:  Patient appears stated age, and in no acute distress Eyes:  Pupils are equal, round, and reactive to light and accomodation.  Extraocular movement is intact.  Conjunctivae are not inflamed  Ears:  Canals normal.  Tympanic membranes normal.  Nose:  Mildly congested turbinates.  No sinus tenderness.  Pharynx:  Normal Neck:  Supple.  Lungs:  Faint scattered rhonchi.  Breath sounds are equal.  Moving air well. Heart:  Regular rate and rhythm without murmurs, rubs, or gallops.  Abdomen:  Nontender without masses or hepatosplenomegaly.  Bowel sounds are present.  No CVA or flank tenderness.  Extremities:  No edema.  Skin:  No rash present.   UC Treatments / Results  Labs (all labs ordered are listed, but only abnormal results are displayed) Labs Reviewed - No data to display  EKG   Radiology No results found.  Procedures Procedures (including critical care time)  Medications Ordered in UC Medications - No data to display  Initial Impression / Assessment and Plan / UC Course  I have reviewed the triage vital signs and the nursing notes.  Pertinent labs & imaging results that were available during my care of the patient were  reviewed by me and considered in my medical decision making (see chart for details).    Begin z-pack and  prednisone  burst/taper. Followup with Family Doctor if not improved in one week.   Final Clinical Impressions(s) / UC Diagnoses   Final diagnoses:  Acute bronchitis, unspecified organism     Discharge Instructions      Take plain guaifenesin (1200mg  extended release tabs such as Mucinex) twice daily, with plenty of water, for cough and congestion.  Get adequate rest.   May use Afrin nasal spray (or generic oxymetazoline) each morning for about 5 days and then discontinue.  Also recommend using saline nasal spray several times daily and saline nasal irrigation (AYR is a common brand).  Use Flonase  nasal spray each morning after using Afrin nasal spray and saline nasal irrigation. Try warm salt water gargles for sore throat.  Stop all antihistamines for now, and other non-prescription cough/cold preparations. May take Delsym Cough Suppressant (12 Hour Cough Relief) at bedtime for nighttime cough.   If symptoms become significantly worse during the night or over the weekend, proceed to the local emergency room.     ED Prescriptions     Medication Sig Dispense Auth. Provider   azithromycin  (ZITHROMAX  Z-PAK) 250 MG tablet Take 2 tabs today; then begin one tab once daily for 4 more days. 6 tablet Pauline Garnette LABOR, MD   predniSONE  (DELTASONE ) 20 MG tablet Take one tab by mouth twice daily for 3 days, then one daily for 2 days. Take with food. 8 tablet Pauline Garnette LABOR, MD           [1]  Social History Tobacco Use   Smoking status: Former    Types: Cigarettes   Smokeless tobacco: Never   Tobacco comments:    1-2 cig/day  Vaping Use   Vaping status: Never Used  Substance Use Topics   Alcohol use: Yes    Alcohol/week: 1.0 - 2.0 standard drink of alcohol    Types: 1 Glasses of wine per week    Comment: socially   Drug use: Never     Pauline Garnette LABOR, MD 04/05/24  2200  "

## 2024-04-04 NOTE — ED Triage Notes (Signed)
 Pt c/o nasal drainage and congestion, post nasal drip, fever 101, bodyaches, productive cough w/yellow/green mucousx4d. Diarrhea started last night. Pt states had 3 bouts of diarrhea

## 2024-04-04 NOTE — Discharge Instructions (Addendum)
 Take plain guaifenesin (1200mg  extended release tabs such as Mucinex) twice daily, with plenty of water, for cough and congestion.  Get adequate rest.   May use Afrin nasal spray (or generic oxymetazoline) each morning for about 5 days and then discontinue.  Also recommend using saline nasal spray several times daily and saline nasal irrigation (AYR is a common brand).  Use Flonase  nasal spray each morning after using Afrin nasal spray and saline nasal irrigation. Try warm salt water gargles for sore throat.  Stop all antihistamines for now, and other non-prescription cough/cold preparations. Continue albuterol  inhaler as prescribed. May take Delsym Cough Suppressant (12 Hour Cough Relief) at bedtime for nighttime cough.   If symptoms become significantly worse during the night or over the weekend, proceed to the local emergency room.

## 2024-04-04 NOTE — Telephone Encounter (Signed)
 Patient went to urgent care

## 2024-04-04 NOTE — Telephone Encounter (Signed)
 Left message advising patient to go to the urgent care.

## 2024-04-04 NOTE — Telephone Encounter (Signed)
 Copied from CRM #[id number]. Topic: Clinical - Medical Advice >> Apr 04, 2024  7:38 AM Delon HERO wrote: Reason for CRM: Patient is calling to report that she had a CPE scheduled today. Provider is out of the office. Patient reporting cough and congestion in her head. No appts today in office. Needing a work note. Patient would like to know where she can go to be evaluated. Please advise.

## 2024-04-07 ENCOUNTER — Ambulatory Visit (INDEPENDENT_AMBULATORY_CARE_PROVIDER_SITE_OTHER): Admitting: Medical-Surgical

## 2024-04-07 ENCOUNTER — Encounter: Payer: Self-pay | Admitting: Medical-Surgical

## 2024-04-07 VITALS — BP 101/68 | HR 83 | Temp 99.3°F | Resp 20 | Ht 64.0 in | Wt 188.1 lb

## 2024-04-07 DIAGNOSIS — D869 Sarcoidosis, unspecified: Secondary | ICD-10-CM | POA: Diagnosis not present

## 2024-04-07 DIAGNOSIS — E559 Vitamin D deficiency, unspecified: Secondary | ICD-10-CM

## 2024-04-07 DIAGNOSIS — E041 Nontoxic single thyroid nodule: Secondary | ICD-10-CM

## 2024-04-07 DIAGNOSIS — R079 Chest pain, unspecified: Secondary | ICD-10-CM | POA: Diagnosis not present

## 2024-04-07 DIAGNOSIS — Z0000 Encounter for general adult medical examination without abnormal findings: Secondary | ICD-10-CM

## 2024-04-07 DIAGNOSIS — M79676 Pain in unspecified toe(s): Secondary | ICD-10-CM | POA: Diagnosis not present

## 2024-04-07 DIAGNOSIS — N951 Menopausal and female climacteric states: Secondary | ICD-10-CM | POA: Diagnosis not present

## 2024-04-07 DIAGNOSIS — D509 Iron deficiency anemia, unspecified: Secondary | ICD-10-CM | POA: Diagnosis not present

## 2024-04-07 DIAGNOSIS — Z113 Encounter for screening for infections with a predominantly sexual mode of transmission: Secondary | ICD-10-CM | POA: Diagnosis not present

## 2024-04-07 DIAGNOSIS — I10 Essential (primary) hypertension: Secondary | ICD-10-CM

## 2024-04-07 DIAGNOSIS — Z131 Encounter for screening for diabetes mellitus: Secondary | ICD-10-CM

## 2024-04-07 NOTE — Progress Notes (Unsigned)
 "  Complete physical exam  Patient: Marissa Chaney   DOB: 07/23/1975   49 y.o. Female  MRN: [id number]  Subjective:    No chief complaint on file.  Marissa Chaney is a 49 y.o. female who presents today for a complete physical exam. She reports consuming a general diet. Working on getting back to exercising on the elliptical. She generally feels fairly well. She reports sleeping well. She does not have additional problems to discuss today.    Most recent fall risk assessment:    11/10/2022    9:31 AM  Fall Risk   Falls in the past year? 0  Number falls in past yr: 0  Injury with Fall? 0   Risk for fall due to : No Fall Risks  Follow up Falls evaluation completed     Data saved with a previous flowsheet row definition     Most recent depression screenings:    10/01/2023   10:30 AM 11/10/2022    9:32 AM  PHQ 2/9 Scores  PHQ - 2 Score 0 1    Vision:Within last year and Dental: No current dental problems and Receives regular dental care  {History (Optional):23778}  Patient Care Team: Willo Mini, NP as PCP - General (Nurse Practitioner) Starla Harland BROCKS, MD as Consulting Physician (Obstetrics and Gynecology) Gustavus Simmonds, DO as Consulting Physician (Rheumatology)   Show/hide medication list[1]  Review of Systems  Constitutional:  Negative for chills, fever, malaise/fatigue and weight loss.  HENT:  Negative for congestion, ear pain, hearing loss, sinus pain and sore throat.   Eyes:  Negative for blurred vision, photophobia and pain.  Respiratory:  Positive for shortness of breath. Negative for cough and wheezing.   Cardiovascular:  Positive for chest pain (chest tightness) and palpitations. Negative for leg swelling.  Gastrointestinal:  Negative for abdominal pain, constipation, diarrhea, heartburn, nausea and vomiting.  Genitourinary:  Negative for dysuria, frequency and urgency.  Musculoskeletal:  Positive for neck pain (radiating down both arms last week). Negative for  falls.  Skin:  Negative for itching and rash.  Neurological:  Negative for dizziness, weakness and headaches.  Endo/Heme/Allergies:  Negative for polydipsia. Does not bruise/bleed easily.  Psychiatric/Behavioral:  Negative for depression, substance abuse and suicidal ideas. The patient is nervous/anxious.      Objective:    LMP 11/19/2018  {Vitals History (Optional):23777}  Physical Exam Vitals reviewed.  Constitutional:      General: She is not in acute distress.    Appearance: Normal appearance. She is not ill-appearing.  HENT:     Head: Normocephalic and atraumatic.     Right Ear: Tympanic membrane, ear canal and external ear normal. There is no impacted cerumen.     Left Ear: Tympanic membrane, ear canal and external ear normal. There is no impacted cerumen.     Nose: Nose normal. No congestion or rhinorrhea.     Mouth/Throat:     Mouth: Mucous membranes are moist.     Pharynx: No oropharyngeal exudate or posterior oropharyngeal erythema.  Eyes:     General: No scleral icterus.       Right eye: No discharge.        Left eye: No discharge.     Extraocular Movements: Extraocular movements intact.     Conjunctiva/sclera: Conjunctivae normal.     Pupils: Pupils are equal, round, and reactive to light.  Neck:     Thyroid : No thyromegaly.     Vascular: No carotid bruit or JVD.  Trachea: Trachea normal.  Cardiovascular:     Rate and Rhythm: Normal rate and regular rhythm.     Pulses: Normal pulses.     Heart sounds: Normal heart sounds. No murmur heard.    No friction rub. No gallop.  Pulmonary:     Effort: Pulmonary effort is normal. No respiratory distress.     Breath sounds: Normal breath sounds. No wheezing.  Abdominal:     General: Bowel sounds are normal. There is no distension.     Palpations: Abdomen is soft.     Tenderness: There is no abdominal tenderness. There is no guarding.  Musculoskeletal:        General: Normal range of motion.     Cervical back:  Normal range of motion and neck supple.  Lymphadenopathy:     Cervical: No cervical adenopathy.  Skin:    General: Skin is warm and dry.  Neurological:     Mental Status: She is alert and oriented to person, place, and time.     Cranial Nerves: No cranial nerve deficit.  Psychiatric:        Mood and Affect: Mood normal.        Behavior: Behavior normal.        Thought Content: Thought content normal.        Judgment: Judgment normal.   No results found for any visits on 04/07/24. {Show previous labs (optional):23779}    Assessment & Plan:    Routine Health Maintenance and Physical Exam  Immunization History  Administered Date(s) Administered   Moderna Sars-Covid-2 Vaccination 01/01/2020, 01/29/2020   Tdap 05/13/2018    Health Maintenance  Topic Date Due   Hepatitis B Vaccines 19-59 Average Risk (1 of 3 - 19+ 3-dose series) Never done   Colonoscopy  Never done   Mammogram  07/02/2022   COVID-19 Vaccine (3 - 2025-26 season) 11/19/2023   Influenza Vaccine  06/17/2024 (Originally 10/19/2023)   DTaP/Tdap/Td (2 - Td or Tdap) 05/13/2028   HPV VACCINES (No Doses Required) Completed   Hepatitis C Screening  Completed   HIV Screening  Completed   Pneumococcal Vaccine  Aged Out   Meningococcal B Vaccine  Aged Out    Discussed health benefits of physical activity, and encouraged her to engage in regular exercise appropriate for her age and condition.    Return in about 6 months (around 10/05/2024) for HTN follow up.     Folashade Gamboa, NP      [1]  Outpatient Medications Prior to Visit  Medication Sig   albuterol  (VENTOLIN  HFA) 108 (90 Base) MCG/ACT inhaler Inhale 1-2 puffs into the lungs every 6 (six) hours as needed for wheezing or shortness of breath.   amLODipine  (NORVASC ) 10 MG tablet Take 1 tablet (10 mg total) by mouth daily.   azithromycin  (ZITHROMAX  Z-PAK) 250 MG tablet Take 2 tabs today; then begin one tab once daily for 4 more days.   predniSONE  (DELTASONE ) 20 MG  tablet Take one tab by mouth twice daily for 3 days, then one daily for 2 days. Take with food.   Vitamin D , Ergocalciferol , (DRISDOL ) 1.25 MG (50000 UNIT) CAPS capsule TAKE 1 CAPSULE (50,000 UNITS TOTAL) BY MOUTH EVERY 7 (SEVEN) DAYS   No facility-administered medications prior to visit.   "

## 2024-04-13 ENCOUNTER — Ambulatory Visit: Payer: Self-pay | Admitting: Medical-Surgical

## 2024-04-13 DIAGNOSIS — R7989 Other specified abnormal findings of blood chemistry: Secondary | ICD-10-CM

## 2024-04-14 LAB — COMPREHENSIVE METABOLIC PANEL WITH GFR
AG Ratio: 1.7 (calc) (ref 1.0–2.5)
ALT: 13 U/L (ref 6–29)
AST: 17 U/L (ref 10–35)
Albumin: 4.2 g/dL (ref 3.6–5.1)
Alkaline phosphatase (APISO): 80 U/L (ref 31–125)
BUN: 7 mg/dL (ref 7–25)
CO2: 26 mmol/L (ref 20–32)
Calcium: 9.9 mg/dL (ref 8.6–10.2)
Chloride: 106 mmol/L (ref 98–110)
Creat: 0.62 mg/dL (ref 0.50–0.99)
Globulin: 2.5 g/dL (ref 1.9–3.7)
Glucose, Bld: 94 mg/dL (ref 65–99)
Potassium: 4.5 mmol/L (ref 3.5–5.3)
Sodium: 139 mmol/L (ref 135–146)
Total Bilirubin: 0.4 mg/dL (ref 0.2–1.2)
Total Protein: 6.7 g/dL (ref 6.1–8.1)
eGFR: 110 mL/min/{1.73_m2}

## 2024-04-14 LAB — HEPATITIS PANEL(REFL)
HEPATITIS C ANTIBODY REFILL$(REFL): NONREACTIVE
Hep B Core Total Ab: NONREACTIVE
Hep B S Ab: REACTIVE — AB
Hepatitis A AB,Total: NONREACTIVE
Hepatitis B Surface Ag: NONREACTIVE

## 2024-04-14 LAB — CBC WITH DIFFERENTIAL/PLATELET
Absolute Lymphocytes: 1887 {cells}/uL (ref 850–3900)
Absolute Monocytes: 599 {cells}/uL (ref 200–950)
Basophils Absolute: 51 {cells}/uL (ref 0–200)
Basophils Relative: 0.9 %
Eosinophils Absolute: 279 {cells}/uL (ref 15–500)
Eosinophils Relative: 4.9 %
HCT: 40.2 % (ref 35.9–46.0)
Hemoglobin: 13 g/dL (ref 11.7–15.5)
MCH: 31.3 pg (ref 27.0–33.0)
MCHC: 32.3 g/dL (ref 31.6–35.4)
MCV: 96.6 fL (ref 81.4–101.7)
MPV: 11.3 fL (ref 7.5–12.5)
Monocytes Relative: 10.5 %
Neutro Abs: 2884 {cells}/uL (ref 1500–7800)
Neutrophils Relative %: 50.6 %
Platelets: 345 10*3/uL (ref 140–400)
RBC: 4.16 Million/uL (ref 3.80–5.10)
RDW: 12.6 % (ref 11.0–15.0)
Total Lymphocyte: 33.1 %
WBC: 5.7 10*3/uL (ref 3.8–10.8)

## 2024-04-14 LAB — THYROID PEROXIDASE ANTIBODIES (TPO) (REFL): Thyroperoxidase Ab SerPl-aCnc: 1 [IU]/mL

## 2024-04-14 LAB — HEPATITIS C ANTIBODY: Hepatitis C Ab: NONREACTIVE

## 2024-04-14 LAB — SEDIMENTATION RATE: Sed Rate: 9 mm/h (ref 0–20)

## 2024-04-14 LAB — URIC ACID: Uric Acid, Serum: 4.2 mg/dL (ref 2.5–7.0)

## 2024-04-14 LAB — URINALYSIS
Bilirubin Urine: NEGATIVE
Glucose, UA: NEGATIVE
Hgb urine dipstick: NEGATIVE
Leukocytes,Ua: NEGATIVE
Nitrite: NEGATIVE
Specific Gravity, Urine: 1.03 (ref 1.001–1.035)
pH: <=5 — AB (ref 5.0–8.0)

## 2024-04-14 LAB — REFLEX TIQ

## 2024-04-14 LAB — HEMOGLOBIN A1C
Hgb A1c MFr Bld: 5.2 %
Mean Plasma Glucose: 103 mg/dL
eAG (mmol/L): 5.7 mmol/L

## 2024-04-14 LAB — PROGESTERONE: Progesterone: 11.2 ng/mL

## 2024-04-14 LAB — ESTRADIOL: Estradiol: 465 pg/mL — ABNORMAL HIGH

## 2024-04-14 LAB — LIPID PANEL
Cholesterol: 213 mg/dL — ABNORMAL HIGH
HDL: 67 mg/dL
LDL Cholesterol (Calc): 130 mg/dL — ABNORMAL HIGH
Non-HDL Cholesterol (Calc): 146 mg/dL — ABNORMAL HIGH
Total CHOL/HDL Ratio: 3.2 (calc)
Triglycerides: 66 mg/dL

## 2024-04-14 LAB — CK: Total CK: 89 U/L (ref 20–239)

## 2024-04-14 LAB — VITAMIN D 25 HYDROXY (VIT D DEFICIENCY, FRACTURES): Vit D, 25-Hydroxy: 38 ng/mL (ref 30–100)

## 2024-04-14 LAB — FSH/LH
FSH: 3.8 m[IU]/mL
LH: 6.7 m[IU]/mL

## 2024-04-14 LAB — IRON,TIBC AND FERRITIN PANEL
%SAT: 28 % (ref 16–45)
Ferritin: 21 ng/mL (ref 16–232)
Iron: 106 ug/dL (ref 40–190)
TIBC: 378 ug/dL (ref 250–450)

## 2024-04-14 LAB — HIV ANTIBODY (ROUTINE TESTING W REFLEX)
HIV 1&2 Ab, 4th Generation: NONREACTIVE
HIV FINAL INTERPRETATION: NEGATIVE

## 2024-04-14 LAB — TSH+FREE T4: TSH W/REFLEX TO FT4: 0.78 m[IU]/L

## 2024-04-14 LAB — RHEUMATOID FACTOR: Rheumatoid fact SerPl-aCnc: <10 [IU]/mL

## 2024-04-14 LAB — T3, FREE: T3, Free: 3.1 pg/mL (ref 2.3–4.2)

## 2024-04-14 LAB — SYPHILIS: RPR W/REFLEX TO RPR TITER AND TREPONEMAL ANTIBODIES, TRADITIONAL SCREENING AND DIAGNOSIS ALGORITHM: RPR Ser Ql: NONREACTIVE

## 2024-10-10 ENCOUNTER — Ambulatory Visit: Admitting: Medical-Surgical
